# Patient Record
Sex: Male | Born: 1980 | Race: Black or African American | Hispanic: No | Marital: Single | Smoking: Current every day smoker
Health system: Southern US, Community
[De-identification: ages and names within clinical notes are randomized; demographics above are authoritative.]

## PROBLEM LIST (undated history)

## (undated) DIAGNOSIS — Z22322 Carrier or suspected carrier of Methicillin resistant Staphylococcus aureus: Secondary | ICD-10-CM

## (undated) DIAGNOSIS — F329 Major depressive disorder, single episode, unspecified: Secondary | ICD-10-CM

## (undated) DIAGNOSIS — F419 Anxiety disorder, unspecified: Secondary | ICD-10-CM

## (undated) DIAGNOSIS — F32A Depression, unspecified: Secondary | ICD-10-CM

## (undated) HISTORY — PX: HEMORRHOID SURGERY: SHX153

---

## 2010-05-24 ENCOUNTER — Emergency Department (HOSPITAL_COMMUNITY)
Admission: EM | Admit: 2010-05-24 | Discharge: 2010-05-24 | Disposition: A | Discharge status: Home / Self Care | Payer: Self-pay | Attending: Emergency Medicine | Admitting: Emergency Medicine

## 2010-05-24 ENCOUNTER — Emergency Department (HOSPITAL_COMMUNITY): Payer: Self-pay

## 2010-05-24 DIAGNOSIS — K644 Residual hemorrhoidal skin tags: Secondary | ICD-10-CM | POA: Insufficient documentation

## 2010-05-24 DIAGNOSIS — K6289 Other specified diseases of anus and rectum: Secondary | ICD-10-CM | POA: Insufficient documentation

## 2010-05-24 LAB — POCT I-STAT, CHEM 8
Calcium, Ion: 1.11 mmol/L — ABNORMAL LOW (ref 1.12–1.32)
Chloride: 107 mEq/L (ref 96–112)
Glucose, Bld: 98 mg/dL (ref 70–99)
HCT: 44 % (ref 39.0–52.0)
Hemoglobin: 15 g/dL (ref 13.0–17.0)

## 2010-05-24 MED ORDER — IOHEXOL 300 MG/ML  SOLN
100.0000 mL | Freq: Once | INTRAMUSCULAR | Status: AC | PRN
  Administered 2010-05-24: 100 mL via INTRAVENOUS

## 2010-05-25 ENCOUNTER — Emergency Department (HOSPITAL_COMMUNITY)
Admission: EM | Admit: 2010-05-25 | Discharge: 2010-05-26 | Disposition: A | Discharge status: Home / Self Care | Payer: Self-pay | Attending: Emergency Medicine | Admitting: Emergency Medicine

## 2010-05-25 DIAGNOSIS — K644 Residual hemorrhoidal skin tags: Secondary | ICD-10-CM | POA: Insufficient documentation

## 2010-05-25 DIAGNOSIS — K612 Anorectal abscess: Secondary | ICD-10-CM | POA: Insufficient documentation

## 2010-05-25 DIAGNOSIS — K6289 Other specified diseases of anus and rectum: Secondary | ICD-10-CM | POA: Insufficient documentation

## 2010-05-26 LAB — CBC
MCHC: 34.1 g/dL (ref 30.0–36.0)
MCV: 86.5 fL (ref 78.0–100.0)
Platelets: 244 10*3/uL (ref 150–400)
RDW: 12.7 % (ref 11.5–15.5)
WBC: 10.7 10*3/uL — ABNORMAL HIGH (ref 4.0–10.5)

## 2010-05-26 LAB — DIFFERENTIAL
Eosinophils Absolute: 0.3 10*3/uL (ref 0.0–0.7)
Eosinophils Relative: 3 % (ref 0–5)
Lymphs Abs: 3.2 10*3/uL (ref 0.7–4.0)

## 2010-05-26 LAB — BASIC METABOLIC PANEL
BUN: 14 mg/dL (ref 6–23)
Calcium: 9.1 mg/dL (ref 8.4–10.5)
Creatinine, Ser: 1.06 mg/dL (ref 0.4–1.5)
GFR calc non Af Amer: >60 mL/min (ref >60)
Potassium: 3.4 mEq/L — ABNORMAL LOW (ref 3.5–5.1)

## 2011-05-12 ENCOUNTER — Emergency Department (HOSPITAL_COMMUNITY): Payer: Self-pay

## 2011-05-12 ENCOUNTER — Emergency Department (HOSPITAL_COMMUNITY)
Admission: EM | Admit: 2011-05-12 | Discharge: 2011-05-12 | Disposition: A | Discharge status: Home / Self Care | Payer: Self-pay | Attending: Emergency Medicine | Admitting: Emergency Medicine

## 2011-05-12 ENCOUNTER — Encounter (HOSPITAL_COMMUNITY): Payer: Self-pay | Admitting: *Deleted

## 2011-05-12 DIAGNOSIS — T07XXXA Unspecified multiple injuries, initial encounter: Secondary | ICD-10-CM

## 2011-05-12 DIAGNOSIS — IMO0002 Reserved for concepts with insufficient information to code with codable children: Secondary | ICD-10-CM | POA: Insufficient documentation

## 2011-05-12 DIAGNOSIS — Y92410 Unspecified street and highway as the place of occurrence of the external cause: Secondary | ICD-10-CM | POA: Insufficient documentation

## 2011-05-12 DIAGNOSIS — M25519 Pain in unspecified shoulder: Secondary | ICD-10-CM | POA: Insufficient documentation

## 2011-05-12 DIAGNOSIS — M25569 Pain in unspecified knee: Secondary | ICD-10-CM | POA: Insufficient documentation

## 2011-05-12 MED ORDER — ACETAMINOPHEN 325 MG PO TABS
650.0000 mg | ORAL_TABLET | Freq: Once | ORAL | Status: AC
  Administered 2011-05-12: 650 mg via ORAL
  Filled 2011-05-12: qty 2

## 2011-05-12 MED ORDER — HYDROCODONE-ACETAMINOPHEN 5-325 MG PO TABS: ORAL_TABLET | ORAL | Status: DC

## 2011-05-12 MED ORDER — IBUPROFEN 800 MG PO TABS
800.0000 mg | ORAL_TABLET | Freq: Once | ORAL | Status: AC
  Administered 2011-05-12: 800 mg via ORAL
  Filled 2011-05-12: qty 1

## 2011-05-12 NOTE — ED Notes (Signed)
Pt sleeping. 
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
## 2011-05-12 NOTE — Discharge Instructions (Signed)
Contusion  A contusion is a deep bruise. Contusions happen when an injury causes bleeding under the skin. Signs of bruising include pain, puffiness (swelling), and discolored skin. The contusion may turn blue, purple, or yellow.  HOME CARE    Put ice on the injured area.   Put ice in a plastic bag.   Place a towel between your skin and the bag.   Leave the ice on for 15 to 20 minutes, 3 to 4 times a day.   Only take medicine as told by your doctor.   Rest the injured area.   If possible, raise (elevate) the injured area to lessen puffiness.  GET HELP RIGHT AWAY IF:    You have more bruising or puffiness.   You have pain that is getting worse.   Your puffiness or pain is not helped by medicine.  MAKE SURE YOU:    Understand these instructions.   Will watch your condition.   Will get help right away if you are not doing well or get worse.  Document Released: 06/21/2007 Document Revised: 12/22/2010 Document Reviewed: 11/07/2010  [employer] Patient Information 2012 [employer].
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
## 2011-05-18 NOTE — ED Provider Notes (Signed)
Medical screening examination/treatment/procedure(s) were performed by non-physician practitioner and as supervising physician I was immediately available for consultation/collaboration. Devoria Albe, MD, FACEP   Ward Givens, MD 05/18/11 [phone]

## 2011-09-06 ENCOUNTER — Encounter (HOSPITAL_COMMUNITY): Payer: Self-pay | Admitting: Emergency Medicine

## 2011-09-06 ENCOUNTER — Emergency Department (HOSPITAL_COMMUNITY)
Admission: EM | Admit: 2011-09-06 | Discharge: 2011-09-06 | Disposition: A | Discharge status: Home / Self Care | Payer: Self-pay | Attending: Emergency Medicine | Admitting: Emergency Medicine

## 2011-09-06 DIAGNOSIS — F17200 Nicotine dependence, unspecified, uncomplicated: Secondary | ICD-10-CM | POA: Insufficient documentation

## 2011-09-06 DIAGNOSIS — R220 Localized swelling, mass and lump, head: Secondary | ICD-10-CM | POA: Insufficient documentation

## 2011-09-06 DIAGNOSIS — R221 Localized swelling, mass and lump, neck: Secondary | ICD-10-CM | POA: Insufficient documentation

## 2011-09-06 DIAGNOSIS — K1379 Other lesions of oral mucosa: Secondary | ICD-10-CM

## 2011-09-06 DIAGNOSIS — F329 Major depressive disorder, single episode, unspecified: Secondary | ICD-10-CM | POA: Insufficient documentation

## 2011-09-06 DIAGNOSIS — F411 Generalized anxiety disorder: Secondary | ICD-10-CM | POA: Insufficient documentation

## 2011-09-06 DIAGNOSIS — K1370 Unspecified lesions of oral mucosa: Secondary | ICD-10-CM | POA: Insufficient documentation

## 2011-09-06 DIAGNOSIS — F3289 Other specified depressive episodes: Secondary | ICD-10-CM | POA: Insufficient documentation

## 2011-09-06 HISTORY — DX: Major depressive disorder, single episode, unspecified: F32.9

## 2011-09-06 HISTORY — DX: Depression, unspecified: F32.A

## 2011-09-06 HISTORY — DX: Anxiety disorder, unspecified: F41.9

## 2011-09-06 MED ORDER — PENICILLIN V POTASSIUM 500 MG PO TABS: 500.0000 mg | ORAL_TABLET | Freq: Four times a day (QID) | ORAL | Status: DC

## 2011-09-06 MED ORDER — IBUPROFEN 800 MG PO TABS
800.0000 mg | ORAL_TABLET | Freq: Once | ORAL | Status: AC
  Administered 2011-09-06: 800 mg via ORAL
  Filled 2011-09-06: qty 1

## 2011-09-06 MED ORDER — PENICILLIN V POTASSIUM 250 MG PO TABS
500.0000 mg | ORAL_TABLET | Freq: Once | ORAL | Status: AC
  Administered 2011-09-06: 500 mg via ORAL
  Filled 2011-09-06: qty 2

## 2011-09-06 MED ORDER — HYDROCODONE-ACETAMINOPHEN 5-325 MG PO TABS: 1.0000 | ORAL_TABLET | ORAL | Status: DC | PRN

## 2011-09-06 MED ORDER — HYDROCODONE-ACETAMINOPHEN 5-325 MG PO TABS
2.0000 | ORAL_TABLET | Freq: Once | ORAL | Status: AC
  Administered 2011-09-06: 2 via ORAL
  Filled 2011-09-06: qty 2

## 2011-09-06 MED ORDER — IBUPROFEN 800 MG PO TABS: 800.0000 mg | ORAL_TABLET | Freq: Three times a day (TID) | ORAL | Status: DC

## 2011-09-06 NOTE — ED Provider Notes (Signed)
History     CSN: [id number]  Arrival date & time 09/06/11  0230   First MD Initiated Contact with Patient 09/06/11 [phone]      Chief Complaint  Patient presents with  . Dental Pain    (Consider location/radiation/quality/duration/timing/severity/associated sxs/prior treatment) HPI Zachary Krause is a 31 y.o. male who presents to the Emergency Department complaining of dental pain that began yesterday. Pain to bottom lower left jaw with gum pain, radiation to cheek and to ear. Denies fever, chills, stiff neck, trismus. He has taken tylenol without relief.  Past Medical History  Diagnosis Date  . Depression   . Anxiety     Past Surgical History  Procedure Date  . Hemorrhoid surgery     History reviewed. No pertinent family history.  History  Substance Use Topics  . Smoking status: Current Everyday Smoker  . Smokeless tobacco: Not on file  . Alcohol Use: Yes     occ      Review of Systems  Constitutional: Negative for fever.       10 Systems reviewed and are negative for acute change except as noted in the HPI.  HENT: Positive for dental problem. Negative for congestion.   Eyes: Negative for discharge and redness.  Respiratory: Negative for cough and shortness of breath.   Cardiovascular: Negative for chest pain.  Gastrointestinal: Negative for vomiting and abdominal pain.  Musculoskeletal: Negative for back pain.  Skin: Negative for rash.  Neurological: Negative for syncope, numbness and headaches.  Psychiatric/Behavioral:       No behavior change.    Allergies  Pollen extract  Home Medications   Current Outpatient Rx  Name Route Sig Dispense Refill  . HYDROCODONE-ACETAMINOPHEN 5-325 MG PO TABS  1 or 2 po q4h prn pain 15 tablet 0    BP 131/82  Pulse 77  Temp 98.5 F (36.9 C) (Oral)  Resp 16  Ht 6' (1.829 m)  Wt 185 lb (83.915 kg)  BMI 25.09 kg/m2  SpO2 99%  Physical Exam  Nursing note and vitals reviewed. Constitutional:       Awake, alert,  nontoxic appearance.  HENT:  Head: Atraumatic.  Right Ear: External ear normal.  Left Ear: External ear normal.       Gums swollen to 2nd molar. Contusion to gumline behind molar. No abscess noted.  Eyes: Right eye exhibits no discharge. Left eye exhibits no discharge.  Neck: Neck supple.  Pulmonary/Chest: Effort normal and breath sounds normal. He exhibits no tenderness.  Abdominal: Soft. There is no tenderness. There is no rebound.  Musculoskeletal: He exhibits no tenderness.       Baseline ROM, no obvious new focal weakness.  Neurological:       Mental status and motor strength appears baseline for patient and situation.  Skin: No rash noted.  Psychiatric: He has a normal mood and affect.    ED Course  Procedures (including critical care time)     MDM  Patient with left lower gum pain and swelling. Initiated antibiotic therapy. Given antiinflammatory and analgesic.Pt stable in ED with no significant deterioration in condition.The patient appears reasonably screened and/or stabilized for discharge and I doubt any other medical condition or other [facility] requiring further screening, evaluation, or treatment in the ED at this time prior to discharge.  MDM Reviewed: nursing note and vitals           Nicoletta Dress. Colon Branch, MD 09/06/11 [phone]

## 2011-09-06 NOTE — Discharge Instructions (Signed)
Rinse with warm salt water. Take all of the antibiotic. Use the ibuprofen and stronger pain medicine as directed. Follow up with a dentist.

## 2011-09-06 NOTE — ED Notes (Signed)
Patient complaining of dental pain started yesterday. 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

## 2011-09-11 ENCOUNTER — Encounter (HOSPITAL_COMMUNITY): Payer: Self-pay | Admitting: *Deleted

## 2011-09-11 ENCOUNTER — Emergency Department (HOSPITAL_COMMUNITY)
Admission: EM | Admit: 2011-09-11 | Discharge: 2011-09-11 | Disposition: A | Discharge status: Home / Self Care | Payer: Self-pay | Attending: Emergency Medicine | Admitting: Emergency Medicine

## 2011-09-11 DIAGNOSIS — K006 Disturbances in tooth eruption: Secondary | ICD-10-CM | POA: Insufficient documentation

## 2011-09-11 DIAGNOSIS — F17200 Nicotine dependence, unspecified, uncomplicated: Secondary | ICD-10-CM | POA: Insufficient documentation

## 2011-09-11 DIAGNOSIS — Z760 Encounter for issue of repeat prescription: Secondary | ICD-10-CM | POA: Insufficient documentation

## 2011-09-11 MED ORDER — PENICILLIN V POTASSIUM 500 MG PO TABS: 500.0000 mg | ORAL_TABLET | Freq: Four times a day (QID) | ORAL | Status: AC

## 2011-09-11 MED ORDER — IBUPROFEN 800 MG PO TABS: 800.0000 mg | ORAL_TABLET | Freq: Once | ORAL | Status: DC

## 2011-09-11 MED ORDER — HYDROCODONE-ACETAMINOPHEN 5-325 MG PO TABS
1.0000 | ORAL_TABLET | Freq: Once | ORAL | Status: AC
  Administered 2011-09-11: 1 via ORAL
  Filled 2011-09-11: qty 1

## 2011-09-11 MED ORDER — PENICILLIN V POTASSIUM 250 MG PO TABS
500.0000 mg | ORAL_TABLET | Freq: Once | ORAL | Status: AC
  Administered 2011-09-11: 500 mg via ORAL
  Filled 2011-09-11: qty 2

## 2011-09-11 MED ORDER — IBUPROFEN 100 MG/5ML PO SUSP: 800.0000 mg | Freq: Once | ORAL | Status: DC

## 2011-09-11 MED ORDER — HYDROCODONE-ACETAMINOPHEN 5-325 MG PO TABS: 1.0000 | ORAL_TABLET | Freq: Four times a day (QID) | ORAL | Status: AC | PRN

## 2011-09-11 MED ORDER — IBUPROFEN 800 MG PO TABS
ORAL_TABLET | ORAL | Status: AC
  Administered 2011-09-11: 800 mg
  Filled 2011-09-11: qty 1

## 2011-09-11 NOTE — ED Notes (Signed)
Pt with dental pain, seen Sat for same and has lost prescription for meds

## 2011-09-11 NOTE — ED Provider Notes (Signed)
History     CSN: [id number]  Arrival date & time 09/11/11  1321   First MD Initiated Contact with Patient 09/11/11 1350      Chief Complaint  Patient presents with  . Dental Pain    (Consider location/radiation/quality/duration/timing/severity/associated sxs/prior treatment) HPI Comments: Here 5 days ago for same c/o.  Written rx's for pen VK and hydrocodone.  "i didn't have any money to get the rx's filled.  i now have the money but i lost my rx's".    Patient is a 31 y.o. male presenting with tooth pain. The history is provided by the patient. No language interpreter was used.  Dental PainThe primary symptoms include mouth pain. Primary symptoms do not include dental injury or fever. Episode onset: ~ 6 days ago. The symptoms are unchanged. The symptoms occur constantly.  Additional symptoms include: jaw pain. Additional symptoms do not include: trismus, facial swelling and drooling.    Past Medical History  Diagnosis Date  . Depression   . Anxiety     Past Surgical History  Procedure Date  . Hemorrhoid surgery     History reviewed. No pertinent family history.  History  Substance Use Topics  . Smoking status: Current Everyday Smoker  . Smokeless tobacco: Not on file  . Alcohol Use: Yes     occ      Review of Systems  Constitutional: Negative for fever and chills.  HENT: Positive for dental problem. Negative for facial swelling and drooling.   Cardiovascular: Negative for chest pain.  All other systems reviewed and are negative.    Allergies  Pollen extract  Home Medications   Current Outpatient Rx  Name Route Sig Dispense Refill  . HYDROCODONE-ACETAMINOPHEN 5-325 MG PO TABS Oral Take 1 tablet by mouth every 6 (six) hours as needed for pain. 20 tablet 0  . PENICILLIN V POTASSIUM 500 MG PO TABS Oral Take 1 tablet (500 mg total) by mouth 4 (four) times daily. 40 tablet 0    BP 116/78  Pulse 68  Temp 97.8 F (36.6 C) (Oral)  Resp 20  Ht 6' (1.829 m)   Wt 185 lb (83.915 kg)  BMI 25.09 kg/m2  SpO2 98%  Physical Exam  Nursing note and vitals reviewed. Constitutional: He is oriented to person, place, and time. He appears well-developed and well-nourished.  HENT:  Head: Normocephalic and atraumatic.  Mouth/Throat:    Eyes: EOM are normal.  Neck: Normal range of motion.  Cardiovascular: Normal rate, regular rhythm, normal heart sounds and intact distal pulses.   Pulmonary/Chest: Effort normal and breath sounds normal. No respiratory distress.  Abdominal: Soft. He exhibits no distension. There is no tenderness.  Musculoskeletal: Normal range of motion.  Neurological: He is alert and oriented to person, place, and time.  Skin: Skin is warm and dry.  Psychiatric: He has a normal mood and affect. Judgment normal.    ED Course  Procedures (including critical care time)  Labs Reviewed - No data to display No results found.   1. Prescription refill       MDM  Hydrocodone, 20 rx-pen VK 500 mg , 40        Evalina Field, [place] 09/11/11 1544

## 2011-09-11 NOTE — ED Notes (Signed)
Dental pain , seen here Saturday , and given Rx, says he has lost them when he tried to get them filled today.

## 2011-09-12 NOTE — ED Provider Notes (Signed)
Medical screening examination/treatment/procedure(s) were performed by non-physician practitioner and as supervising physician I was immediately available for consultation/collaboration.   Carleene Cooper III, MD 09/12/11 [phone]

## 2011-10-01 ENCOUNTER — Emergency Department (HOSPITAL_COMMUNITY)
Admission: EM | Admit: 2011-10-01 | Discharge: 2011-10-01 | Discharge status: Against Medical Advice | Payer: Self-pay | Attending: Emergency Medicine | Admitting: Emergency Medicine

## 2011-10-01 ENCOUNTER — Encounter (HOSPITAL_COMMUNITY): Payer: Self-pay | Admitting: Emergency Medicine

## 2011-10-01 DIAGNOSIS — R21 Rash and other nonspecific skin eruption: Secondary | ICD-10-CM | POA: Insufficient documentation

## 2011-10-01 NOTE — ED Notes (Signed)
Pt states that he has to leave because he has to go to work, pt signed AMA form and left ambulatory and in stable condition

## 2011-10-01 NOTE — ED Notes (Signed)
Pt with rash to bilateral forearms, trunk and lower back x 3 days; states that he has working outside about 3 days ago

## 2011-10-01 NOTE — ED Notes (Signed)
Pt states rash appeared 3 days ago after leaving work. Rashes located on arms, abdomen, and back. C/O of itching and burning sometimes. Gradually appeared. States that the rashes are painful at certain times of the day.

## 2011-11-25 ENCOUNTER — Emergency Department (HOSPITAL_COMMUNITY): Payer: No Typology Code available for payment source

## 2011-11-25 ENCOUNTER — Emergency Department (HOSPITAL_COMMUNITY)
Admission: EM | Admit: 2011-11-25 | Discharge: 2011-11-25 | Disposition: A | Discharge status: Home / Self Care | Payer: No Typology Code available for payment source | Attending: Emergency Medicine | Admitting: Emergency Medicine

## 2011-11-25 ENCOUNTER — Encounter (HOSPITAL_COMMUNITY): Payer: Self-pay | Admitting: *Deleted

## 2011-11-25 DIAGNOSIS — F17200 Nicotine dependence, unspecified, uncomplicated: Secondary | ICD-10-CM | POA: Insufficient documentation

## 2011-11-25 DIAGNOSIS — S63502A Unspecified sprain of left wrist, initial encounter: Secondary | ICD-10-CM

## 2011-11-25 DIAGNOSIS — F411 Generalized anxiety disorder: Secondary | ICD-10-CM | POA: Insufficient documentation

## 2011-11-25 DIAGNOSIS — S60222A Contusion of left hand, initial encounter: Secondary | ICD-10-CM

## 2011-11-25 DIAGNOSIS — IMO0002 Reserved for concepts with insufficient information to code with codable children: Secondary | ICD-10-CM | POA: Insufficient documentation

## 2011-11-25 DIAGNOSIS — T07XXXA Unspecified multiple injuries, initial encounter: Secondary | ICD-10-CM

## 2011-11-25 DIAGNOSIS — Y9355 Activity, bike riding: Secondary | ICD-10-CM | POA: Insufficient documentation

## 2011-11-25 DIAGNOSIS — F329 Major depressive disorder, single episode, unspecified: Secondary | ICD-10-CM | POA: Insufficient documentation

## 2011-11-25 DIAGNOSIS — S60229A Contusion of unspecified hand, initial encounter: Secondary | ICD-10-CM | POA: Insufficient documentation

## 2011-11-25 DIAGNOSIS — S59909A Unspecified injury of unspecified elbow, initial encounter: Secondary | ICD-10-CM | POA: Insufficient documentation

## 2011-11-25 DIAGNOSIS — F3289 Other specified depressive episodes: Secondary | ICD-10-CM | POA: Insufficient documentation

## 2011-11-25 DIAGNOSIS — S6990XA Unspecified injury of unspecified wrist, hand and finger(s), initial encounter: Secondary | ICD-10-CM | POA: Insufficient documentation

## 2011-11-25 DIAGNOSIS — S63599A Other specified sprain of unspecified wrist, initial encounter: Secondary | ICD-10-CM | POA: Insufficient documentation

## 2011-11-25 DIAGNOSIS — S66819A Strain of other specified muscles, fascia and tendons at wrist and hand level, unspecified hand, initial encounter: Secondary | ICD-10-CM | POA: Insufficient documentation

## 2011-11-25 DIAGNOSIS — S59919A Unspecified injury of unspecified forearm, initial encounter: Secondary | ICD-10-CM | POA: Insufficient documentation

## 2011-11-25 MED ORDER — NAPROXEN 250 MG PO TABS: 250.0000 mg | ORAL_TABLET | Freq: Two times a day (BID) | ORAL | Status: DC

## 2011-11-25 MED ORDER — TETANUS-DIPHTH-ACELL PERTUSSIS 5-2.5-18.5 LF-MCG/0.5 IM SUSP
0.5000 mL | Freq: Once | INTRAMUSCULAR | Status: AC
  Administered 2011-11-25: 0.5 mL via INTRAMUSCULAR
  Filled 2011-11-25: qty 0.5

## 2011-11-25 MED ORDER — METHOCARBAMOL 500 MG PO TABS: 1000.0000 mg | ORAL_TABLET | Freq: Four times a day (QID) | ORAL | Status: DC | PRN

## 2011-11-25 MED ORDER — HYDROCODONE-ACETAMINOPHEN 5-325 MG PO TABS: ORAL_TABLET | ORAL | Status: DC

## 2011-11-25 MED ORDER — OXYCODONE-ACETAMINOPHEN 5-325 MG PO TABS
1.0000 | ORAL_TABLET | Freq: Once | ORAL | Status: AC
  Administered 2011-11-25: 1 via ORAL
  Filled 2011-11-25: qty 1

## 2011-11-25 MED ORDER — IBUPROFEN 400 MG PO TABS
400.0000 mg | ORAL_TABLET | Freq: Once | ORAL | Status: AC
  Administered 2011-11-25: 400 mg via ORAL
  Filled 2011-11-25: qty 1
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
## 2011-12-01 ENCOUNTER — Emergency Department (HOSPITAL_COMMUNITY)
Admission: EM | Admit: 2011-12-01 | Discharge: 2011-12-01 | Disposition: A | Discharge status: Home / Self Care | Payer: No Typology Code available for payment source | Attending: Emergency Medicine | Admitting: Emergency Medicine

## 2011-12-01 ENCOUNTER — Encounter (HOSPITAL_COMMUNITY): Payer: Self-pay | Admitting: Emergency Medicine

## 2011-12-01 DIAGNOSIS — F329 Major depressive disorder, single episode, unspecified: Secondary | ICD-10-CM | POA: Insufficient documentation

## 2011-12-01 DIAGNOSIS — Z8659 Personal history of other mental and behavioral disorders: Secondary | ICD-10-CM | POA: Insufficient documentation

## 2011-12-01 DIAGNOSIS — F3289 Other specified depressive episodes: Secondary | ICD-10-CM | POA: Insufficient documentation

## 2011-12-01 DIAGNOSIS — L089 Local infection of the skin and subcutaneous tissue, unspecified: Secondary | ICD-10-CM

## 2011-12-01 DIAGNOSIS — Y9389 Activity, other specified: Secondary | ICD-10-CM | POA: Insufficient documentation

## 2011-12-01 DIAGNOSIS — F17200 Nicotine dependence, unspecified, uncomplicated: Secondary | ICD-10-CM | POA: Insufficient documentation

## 2011-12-01 MED ORDER — CEFTRIAXONE SODIUM 1 G IJ SOLR
1.0000 g | Freq: Once | INTRAMUSCULAR | Status: AC
  Administered 2011-12-01: 1 g via INTRAMUSCULAR
  Filled 2011-12-01: qty 10

## 2011-12-01 MED ORDER — HYDROCODONE-ACETAMINOPHEN 5-500 MG PO TABS: 1.0000 | ORAL_TABLET | Freq: Four times a day (QID) | ORAL | Status: DC | PRN

## 2011-12-01 MED ORDER — LIDOCAINE HCL (PF) 1 % IJ SOLN
2.0000 mL | Freq: Once | INTRAMUSCULAR | Status: AC
  Administered 2011-12-01: 2 mL via INTRADERMAL
  Filled 2011-12-01: qty 5

## 2011-12-01 MED ORDER — CEPHALEXIN 500 MG PO CAPS: 500.0000 mg | ORAL_CAPSULE | Freq: Four times a day (QID) | ORAL | Status: DC
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
## 2011-12-27 ENCOUNTER — Encounter (HOSPITAL_COMMUNITY): Payer: Self-pay | Admitting: Emergency Medicine

## 2011-12-27 ENCOUNTER — Emergency Department (HOSPITAL_COMMUNITY)
Admission: EM | Admit: 2011-12-27 | Discharge: 2011-12-27 | Disposition: A | Discharge status: Home / Self Care | Payer: Self-pay | Attending: Emergency Medicine | Admitting: Emergency Medicine

## 2011-12-27 DIAGNOSIS — L02419 Cutaneous abscess of limb, unspecified: Secondary | ICD-10-CM | POA: Insufficient documentation

## 2011-12-27 DIAGNOSIS — Z79899 Other long term (current) drug therapy: Secondary | ICD-10-CM | POA: Insufficient documentation

## 2011-12-27 DIAGNOSIS — F17200 Nicotine dependence, unspecified, uncomplicated: Secondary | ICD-10-CM | POA: Insufficient documentation

## 2011-12-27 DIAGNOSIS — L03119 Cellulitis of unspecified part of limb: Secondary | ICD-10-CM | POA: Insufficient documentation

## 2011-12-27 DIAGNOSIS — L0291 Cutaneous abscess, unspecified: Secondary | ICD-10-CM

## 2011-12-27 DIAGNOSIS — Z8659 Personal history of other mental and behavioral disorders: Secondary | ICD-10-CM | POA: Insufficient documentation

## 2011-12-27 HISTORY — DX: Carrier or suspected carrier of methicillin resistant Staphylococcus aureus: Z22.322

## 2011-12-27 MED ORDER — CEPHALEXIN 500 MG PO CAPS: 500.0000 mg | ORAL_CAPSULE | Freq: Four times a day (QID) | ORAL | Status: DC

## 2011-12-27 MED ORDER — SULFAMETHOXAZOLE-TRIMETHOPRIM 800-160 MG PO TABS: 1.0000 | ORAL_TABLET | Freq: Two times a day (BID) | ORAL | Status: DC

## 2011-12-27 MED ORDER — LIDOCAINE-EPINEPHRINE (PF) 2 %-1:200000 IJ SOLN
20.0000 mL | Freq: Once | INTRAMUSCULAR | Status: AC
  Administered 2011-12-27: 20 mL

## 2011-12-27 MED ORDER — LIDOCAINE-EPINEPHRINE 2 %-1:100000 IJ SOLN: 30.0000 mL | Freq: Once | INTRAMUSCULAR | Status: DC

## 2011-12-27 MED ORDER — LIDOCAINE-EPINEPHRINE (PF) 2 %-1:200000 IJ SOLN
INTRAMUSCULAR | Status: AC
  Administered 2011-12-27: 20 mL
  Filled 2011-12-27: qty 20
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
## 2011-12-28 ENCOUNTER — Emergency Department (HOSPITAL_COMMUNITY)
Admission: EM | Admit: 2011-12-28 | Discharge: 2011-12-28 | Disposition: A | Discharge status: Home / Self Care | Payer: Self-pay | Attending: Emergency Medicine | Admitting: Emergency Medicine

## 2011-12-28 ENCOUNTER — Encounter (HOSPITAL_COMMUNITY): Payer: Self-pay | Admitting: Emergency Medicine

## 2011-12-28 DIAGNOSIS — Z8659 Personal history of other mental and behavioral disorders: Secondary | ICD-10-CM | POA: Insufficient documentation

## 2011-12-28 DIAGNOSIS — F17200 Nicotine dependence, unspecified, uncomplicated: Secondary | ICD-10-CM | POA: Insufficient documentation

## 2011-12-28 DIAGNOSIS — L0201 Cutaneous abscess of face: Secondary | ICD-10-CM | POA: Insufficient documentation

## 2011-12-28 DIAGNOSIS — L03211 Cellulitis of face: Secondary | ICD-10-CM | POA: Insufficient documentation

## 2011-12-28 MED ORDER — LIDOCAINE-EPINEPHRINE (PF) 1 %-1:200000 IJ SOLN
INTRAMUSCULAR | Status: AC
  Administered 2011-12-28: 10 mL
  Filled 2011-12-28: qty 10

## 2011-12-28 MED ORDER — BACITRACIN-NEOMYCIN-POLYMYXIN 400-5-5000 EX OINT
TOPICAL_OINTMENT | CUTANEOUS | Status: AC
  Administered 2011-12-28: 4
  Filled 2011-12-28: qty 4

## 2011-12-28 NOTE — ED Notes (Signed)
Patient was seen here earlier and had abscesses I&D.  Abscess to left eyebrow was not able to be I&D; patient states has been using warm compresses and states it began draining around 9pm.  Patient here to have wound rechecked.

## 2011-12-28 NOTE — ED Notes (Signed)
Pt alert & oriented x4, stable gait. Patient given discharge instructions, paperwork & prescription(s). Patient  instructed to stop at the registration desk to finish any additional paperwork. Patient verbalized understanding. Pt left department w/ no further questions. 

## 2011-12-28 NOTE — ED Provider Notes (Signed)
History     CSN: [id number]  Arrival date & time 12/28/11  0031   First MD Initiated Contact with Patient 12/28/11 0035      Chief Complaint  Patient presents with  . Abscess    (Consider location/radiation/quality/duration/timing/severity/associated sxs/prior treatment) The history is provided by the patient.   patient reports he had an incision and drainage done of his left eyebrow earlier today in the emergency department but reports that the swelling of his left eyebrow seems to be worse and states that the provider who saw him stated that they were unable to get all the drainage.  He's concerned about the worsening swelling.  No fever or spreading redness.  No other complaints.  His symptoms are mild in severity.  Nothing worsens or improves his symptoms.  No change in his vision.  No pain with range of motion of his eyes  Past Medical History  Diagnosis Date  . Depression   . Anxiety     Past Surgical History  Procedure Date  . Hemorrhoid surgery     Family History  Problem Relation Age of Onset  . Hypertension Other   . Cancer Other     History  Substance Use Topics  . Smoking status: Current Every Day Smoker -- 0.5 packs/day for 13 years    Types: Cigarettes  . Smokeless tobacco: Never Used  . Alcohol Use: Yes     Comment: occ      Review of Systems  All other systems reviewed and are negative.    Allergies  Pollen extract  Home Medications   Current Outpatient Rx  Name  Route  Sig  Dispense  Refill  . CEPHALEXIN 500 MG PO CAPS   Oral   Take 1 capsule (500 mg total) by mouth 4 (four) times daily.   40 capsule   0   . SULFAMETHOXAZOLE-TRIMETHOPRIM 800-160 MG PO TABS   Oral   Take 1 tablet by mouth every 12 (twelve) hours.   20 tablet   0     BP 146/82  Pulse 88  Temp 98 F (36.7 C) (Oral)  Resp 18  Ht 6' (1.829 m)  Wt 190 lb (86.183 kg)  BMI 25.77 kg/m2  SpO2 100%  Physical Exam  Constitutional: He is oriented to person,  place, and time. He appears well-developed and well-nourished.  HENT:  Head: Normocephalic.       Abscess of the small amount of drainage of his left upper lateral eyebrow.  No surrounding erythema or warmth or tenderness.  Normal extraocular movement  Eyes: EOM are normal.  Neck: Normal range of motion.  Pulmonary/Chest: Effort normal.  Abdominal: He exhibits no distension.  Musculoskeletal: Normal range of motion.  Neurological: He is alert and oriented to person, place, and time.  Psychiatric: He has a normal mood and affect.    ED Course  Procedures (including critical care time)  INCISION AND DRAINAGE Performed by: Lyanne Co Consent: Verbal consent obtained. Risks and benefits: risks, benefits and alternatives were discussed Time out performed prior to procedure Type: abscess Body area: left eyebrow Anesthesia: local infiltration Incision was made with a scalpel. Local anesthetic: lidocaine 2% with epinephrine Anesthetic total: 3 ml Complexity: complex Blunt dissection to break up loculations Drainage: purulent Drainage amount: moderate Packing material: none Patient tolerance: Patient tolerated the procedure well with no immediate complications.     Labs Reviewed - No data to display No results found.   1. Abscess of face  MDM  Repeat incision and drainage of left eyebrow with moderate amount of purulent drainage obtained.  The patient just started his Bactrim and Keflex today.  I recommended that he continue this.        Lyanne Co, MD 12/28/11 [phone]

## 2011-12-29 ENCOUNTER — Telehealth (HOSPITAL_COMMUNITY): Payer: Self-pay | Admitting: Emergency Medicine

## 2011-12-29 LAB — CULTURE, ROUTINE-ABSCESS

## 2011-12-30 ENCOUNTER — Encounter (HOSPITAL_COMMUNITY): Payer: Self-pay | Admitting: Emergency Medicine

## 2011-12-30 ENCOUNTER — Telehealth (HOSPITAL_COMMUNITY): Payer: Self-pay | Admitting: Emergency Medicine
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
## 2011-12-30 NOTE — ED Notes (Signed)
+  Abscess. +MRSA. Patient treated with Septra DS. Sensitive to same. Per protocol MD.

## 2011-12-30 NOTE — ED Provider Notes (Signed)
Medical screening examination/treatment/procedure(s) were performed by non-physician practitioner and as supervising physician I was immediately available for consultation/collaboration.   Laray Anger, DO 12/30/11 1336

## 2011-12-31 ENCOUNTER — Telehealth (HOSPITAL_COMMUNITY): Payer: Self-pay | Admitting: Emergency Medicine

## 2012-01-01 NOTE — ED Notes (Signed)
Unable to contact patient via phone. Sent letter. °

## 2012-01-07 NOTE — ED Notes (Signed)
Patient treated per protocol. Chart sent to Medical Records. °

## 2012-11-22 ENCOUNTER — Encounter (HOSPITAL_COMMUNITY): Payer: Self-pay | Admitting: Emergency Medicine

## 2012-11-22 ENCOUNTER — Emergency Department (HOSPITAL_COMMUNITY)
Admission: EM | Admit: 2012-11-22 | Discharge: 2012-11-22 | Disposition: A | Discharge status: Home / Self Care | Payer: Self-pay | Attending: Emergency Medicine | Admitting: Emergency Medicine

## 2012-11-22 ENCOUNTER — Emergency Department (HOSPITAL_COMMUNITY): Payer: Self-pay

## 2012-11-22 DIAGNOSIS — M79609 Pain in unspecified limb: Secondary | ICD-10-CM | POA: Insufficient documentation

## 2012-11-22 DIAGNOSIS — R21 Rash and other nonspecific skin eruption: Secondary | ICD-10-CM | POA: Insufficient documentation

## 2012-11-22 DIAGNOSIS — Z792 Long term (current) use of antibiotics: Secondary | ICD-10-CM | POA: Insufficient documentation

## 2012-11-22 DIAGNOSIS — F419 Anxiety disorder, unspecified: Secondary | ICD-10-CM

## 2012-11-22 DIAGNOSIS — Z79899 Other long term (current) drug therapy: Secondary | ICD-10-CM | POA: Insufficient documentation

## 2012-11-22 DIAGNOSIS — Z8659 Personal history of other mental and behavioral disorders: Secondary | ICD-10-CM | POA: Insufficient documentation

## 2012-11-22 DIAGNOSIS — Z8614 Personal history of Methicillin resistant Staphylococcus aureus infection: Secondary | ICD-10-CM | POA: Insufficient documentation

## 2012-11-22 DIAGNOSIS — F17200 Nicotine dependence, unspecified, uncomplicated: Secondary | ICD-10-CM | POA: Insufficient documentation

## 2012-11-22 DIAGNOSIS — F411 Generalized anxiety disorder: Secondary | ICD-10-CM | POA: Insufficient documentation

## 2012-11-22 DIAGNOSIS — R51 Headache: Secondary | ICD-10-CM | POA: Insufficient documentation

## 2012-11-22 DIAGNOSIS — M79675 Pain in left toe(s): Secondary | ICD-10-CM

## 2012-11-22 MED ORDER — NAPROXEN 500 MG PO TABS: 500.0000 mg | ORAL_TABLET | Freq: Two times a day (BID) | ORAL | Status: DC
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
## 2012-11-22 NOTE — ED Provider Notes (Signed)
CSN: [id number]     Arrival date & time 11/22/12  4098 History  This chart was scribed for Zachary Jakes, MD by Leone Payor, ED Scribe. This patient was seen in room APA10/APA10 and the patient's care was started 7:12 AM.    Chief Complaint  Patient presents with  . Toe Pain    The history is provided by the patient. No language interpreter was used.     HPI Comments: Zachary Krause is a 32 y.o. male who presents to the Emergency Department complaining of constant, gradually worsening left pinky toe pain for over 5 months ago. Pt states he has struck the area on various objects while walking and believes it may be broken. Pt also has discoloration and thickness to the toenails which he believes will require anti-fungals and would like to be treated for this. Pt states the pain is worse while wearing shoes. He denies weakness or numbness.   Pt also complains of a history of anxiety and depression for many years. Pt has been off his medications for about 5 years and would like to follow up to resume care for this. He denies SI, HI.   Past Medical History  Diagnosis Date  . Depression   . Anxiety   . MRSA (methicillin resistant staph aureus) culture positive    Past Surgical History  Procedure Laterality Date  . Hemorrhoid surgery     Family History  Problem Relation Age of Onset  . Hypertension Other   . Cancer Other    History  Substance Use Topics  . Smoking status: Current Every Day Smoker -- 0.50 packs/day for 13 years    Types: Cigarettes  . Smokeless tobacco: Never Used  . Alcohol Use: Yes     Comment: occ    Review of Systems  Constitutional: Negative for fever and chills.  HENT: Negative for rhinorrhea and sore throat.   Eyes: Negative for visual disturbance.  Respiratory: Negative for cough and shortness of breath.   Cardiovascular: Negative for chest pain and leg swelling.  Gastrointestinal: Negative for nausea, vomiting, abdominal pain and diarrhea.   Genitourinary: Negative for dysuria.  Musculoskeletal: Positive for arthralgias (left pinky toe pain). Negative for back pain.  Skin: Positive for rash.  Neurological: Positive for headaches.  Hematological: Does not bruise/bleed easily.  Psychiatric/Behavioral: Negative for confusion.    Allergies  Pollen extract  Home Medications   Current Outpatient Rx  Name  Route  Sig  Dispense  Refill  . cephALEXin (KEFLEX) 500 MG capsule   Oral   Take 1 capsule (500 mg total) by mouth 4 (four) times daily.   40 capsule   0   . naproxen (NAPROSYN) 500 MG tablet   Oral   Take 1 tablet (500 mg total) by mouth 2 (two) times daily.   14 tablet   0   . sulfamethoxazole-trimethoprim (SEPTRA DS) 800-160 MG per tablet   Oral   Take 1 tablet by mouth every 12 (twelve) hours.   20 tablet   0    BP 122/78  Pulse 67  Temp(Src) 98.2 F (36.8 C) (Oral)  Resp 20  Ht 6' (1.829 m)  Wt 185 lb 9 oz (84.171 kg)  BMI 25.16 kg/m2  SpO2 100% Physical Exam  Nursing note and vitals reviewed. Constitutional: He is oriented to person, place, and time. He appears well-developed and well-nourished.  HENT:  Head: Normocephalic and atraumatic.  Cardiovascular: Normal rate.   Pulmonary/Chest: Effort normal.  Abdominal: Soft. Bowel  sounds are normal. He exhibits no distension. There is no tenderness.  Neurological: He is alert and oriented to person, place, and time. He has normal strength. No cranial nerve deficit. He exhibits normal muscle tone.  Skin: Skin is warm and dry.  Psychiatric: He has a normal mood and affect.    ED Course  Procedures   DIAGNOSTIC STUDIES: Oxygen Saturation is 100% on RA, normal by my interpretation.    COORDINATION OF CARE: 7:32 AM Discussed with the need to seek a PCP. Pt understands that he needs regular follow up for anti-fungals as well as his psych medications. Discussed treatment plan with pt at bedside and pt agreed to plan.   Labs Review Labs Reviewed -  No data to display Imaging Review Dg Foot Complete Left  11/22/2012   CLINICAL DATA:  Pain post trauma  EXAM: LEFT FOOT - COMPLETE 3+ VIEW  COMPARISON:  None.  FINDINGS: Frontal, oblique, and lateral views were obtained. There is no fracture or dislocation. Joint spaces appear intact. No erosive change.  IMPRESSION: No abnormality noted.   Electronically Signed   By: Bretta Bang M.D.   On: 11/22/2012 08:04    EKG Interpretation   None       MDM   1. Toe pain, left   2. Anxiety    Patient with several month history of left little toe pain x-ray showed no fracture or evidence of infection clinical exam shows no evidence of open wounds or cellulitis of the feet. Does have chronic toenail infection. Recommend followup with podiatry referrals provided. Patient also raises concerns about anxiety and depression denies any suicidal or homicidal ideations patient given behavioral health outpatient referral information.  I personally performed the services described in this documentation, which was scribed in my presence. The recorded information has been reviewed and is accurate.    Zachary Jakes, MD 11/22/12 [phone]

## 2014-07-08 ENCOUNTER — Encounter (HOSPITAL_COMMUNITY): Payer: Self-pay | Admitting: Emergency Medicine

## 2014-07-08 ENCOUNTER — Emergency Department (HOSPITAL_COMMUNITY)
Admission: EM | Admit: 2014-07-08 | Discharge: 2014-07-08 | Disposition: A | Discharge status: Home / Self Care | Payer: Self-pay | Attending: Emergency Medicine | Admitting: Emergency Medicine

## 2014-07-08 DIAGNOSIS — Z792 Long term (current) use of antibiotics: Secondary | ICD-10-CM | POA: Insufficient documentation

## 2014-07-08 DIAGNOSIS — Z791 Long term (current) use of non-steroidal anti-inflammatories (NSAID): Secondary | ICD-10-CM | POA: Insufficient documentation

## 2014-07-08 DIAGNOSIS — L02416 Cutaneous abscess of left lower limb: Secondary | ICD-10-CM | POA: Insufficient documentation

## 2014-07-08 DIAGNOSIS — Z720 Tobacco use: Secondary | ICD-10-CM | POA: Insufficient documentation

## 2014-07-08 DIAGNOSIS — F329 Major depressive disorder, single episode, unspecified: Secondary | ICD-10-CM | POA: Insufficient documentation

## 2014-07-08 MED ORDER — SULFAMETHOXAZOLE-TRIMETHOPRIM 800-160 MG PO TABS
1.0000 | ORAL_TABLET | Freq: Once | ORAL | Status: AC
  Administered 2014-07-08: 1 via ORAL
  Filled 2014-07-08: qty 1

## 2014-07-08 MED ORDER — PREDNISONE 50 MG PO TABS
60.0000 mg | ORAL_TABLET | Freq: Once | ORAL | Status: AC
  Administered 2014-07-08: 60 mg via ORAL
  Filled 2014-07-08 (×2): qty 1

## 2014-07-08 MED ORDER — IBUPROFEN 800 MG PO TABS
800.0000 mg | ORAL_TABLET | Freq: Once | ORAL | Status: AC
  Administered 2014-07-08: 800 mg via ORAL
  Filled 2014-07-08: qty 1

## 2014-07-08 MED ORDER — CLINDAMYCIN HCL 150 MG PO CAPS: 150.0000 mg | ORAL_CAPSULE | Freq: Four times a day (QID) | ORAL | Status: DC

## 2014-07-08 MED ORDER — DEXAMETHASONE 4 MG PO TABS: 4.0000 mg | ORAL_TABLET | Freq: Two times a day (BID) | ORAL | Status: DC

## 2014-07-08 NOTE — ED Provider Notes (Signed)
CSN: [id number]     Arrival date & time 07/08/14  1724 History   First MD Initiated Contact with Patient 07/08/14 1746     Chief Complaint  Patient presents with  . Abscess     (Consider location/radiation/quality/duration/timing/severity/associated sxs/prior Treatment) Patient is a 34 y.o. male presenting with abscess. The history is provided by the patient.  Abscess Location:  Leg Leg abscess location:  L upper leg Abscess quality: itching, painful and warmth   Abscess quality: not draining and not weeping   Red streaking: no   Duration:  2 days Progression:  Worsening Pain details:    Quality:  Aching   Severity:  Moderate   Duration:  2 days   Timing:  Intermittent   Progression:  Worsening Context: not diabetes and not immunosuppression   Relieved by:  Nothing Ineffective treatments:  None tried Associated symptoms: no fatigue, no fever, no nausea and no vomiting   Risk factors: hx of MRSA     Past Medical History  Diagnosis Date  . Depression   . Anxiety   . MRSA (methicillin resistant staph aureus) culture positive    Past Surgical History  Procedure Laterality Date  . Hemorrhoid surgery     Family History  Problem Relation Age of Onset  . Hypertension Other   . Cancer Other    History  Substance Use Topics  . Smoking status: Current Every Day Smoker -- 0.50 packs/day for 13 years    Types: Cigarettes  . Smokeless tobacco: Never Used  . Alcohol Use: Yes     Comment: occ    Review of Systems  Constitutional: Negative for fever and fatigue.  Gastrointestinal: Negative for nausea and vomiting.  Skin: Positive for wound.  Psychiatric/Behavioral: The patient is nervous/anxious.        Depression      Allergies  Pollen extract  Home Medications   Prior to Admission medications   Medication Sig Start Date End Date Taking? Authorizing Provider  cephALEXin (KEFLEX) 500 MG capsule Take 1 capsule (500 mg total) by mouth 4 (four) times daily.  12/27/11   Burgess Amor, PA-C  naproxen (NAPROSYN) 500 MG tablet Take 1 tablet (500 mg total) by mouth 2 (two) times daily. 11/22/12   Vanetta Mulders, MD  sulfamethoxazole-trimethoprim (SEPTRA DS) 800-160 MG per tablet Take 1 tablet by mouth every 12 (twelve) hours. 12/27/11   Burgess Amor, PA-C   BP 137/81 mmHg  Pulse 89  Temp(Src) 97.9 F (36.6 C) (Oral)  Ht 6' (1.829 m)  Wt 189 lb (85.73 kg)  BMI 25.63 kg/m2  SpO2 97% Physical Exam  Constitutional: He is oriented to person, place, and time. He appears well-developed and well-nourished.  Non-toxic appearance.  HENT:  Head: Normocephalic.  Right Ear: Tympanic membrane and external ear normal.  Left Ear: Tympanic membrane and external ear normal.  Eyes: EOM and lids are normal. Pupils are equal, round, and reactive to light.  Neck: Normal range of motion. Neck supple. Carotid bruit is not present.  Cardiovascular: Normal rate, regular rhythm, normal heart sounds, intact distal pulses and normal pulses.   Pulmonary/Chest: Breath sounds normal. No respiratory distress.  Abdominal: Soft. Bowel sounds are normal. There is no tenderness. There is no guarding.  Musculoskeletal: Normal range of motion.  Lymphadenopathy:       Head (right side): No submandibular adenopathy present.       Head (left side): No submandibular adenopathy present.    He has no cervical adenopathy.  Neurological: He  is alert and oriented to person, place, and time. He has normal strength. No cranial nerve deficit or sensory deficit.  Skin: Skin is warm and dry.  Small slightly raised abscess area of the anterior left thigh. No red streaking appreciated. No satellite abscess areas noted. No palpable inguinal nodes.  Psychiatric: He has a normal mood and affect. His speech is normal.  Nursing note and vitals reviewed.   ED Course  Procedures (including critical care time) Labs Review Labs Reviewed - No data to display  Imaging Review No results found.   EKG  Interpretation None      MDM  Abscess noted of the left upper thigh. No vital sign changes. No drainage appreciated at this time. The patient will be treated with clindamycin and Decadron. Patient will use warm tub soaks daily. He will return if any signs of advancing infection.    Final diagnoses:  None    *I have reviewed nursing notes, vital signs, and all appropriate lab and imaging results for this patient.[address], PA-C 07/08/14 1810  Zadie Rhine, MD 07/09/14 [phone]

## 2014-07-08 NOTE — Discharge Instructions (Signed)
Please soak the left thigh in warm Epsom salt water for routine minutes daily until the wound has healed. Please use clindamycin with breakfast, lunch, dinner, and at bedtime until all taken. Please use Decadron 2 times daily with food. Abscess An abscess (boil or furuncle) is an infected area on or under the skin. This area is filled with yellowish-white fluid (pus) and other material (debris). HOME CARE   Only take medicines as told by your doctor.  If you were given antibiotic medicine, take it as directed. Finish the medicine even if you start to feel better.  If gauze is used, follow your doctor's directions for changing the gauze.  To avoid spreading the infection:  Keep your abscess covered with a bandage.  Wash your hands well.  Do not share personal care items, towels, or whirlpools with others.  Avoid skin contact with others.  Keep your skin and clothes clean around the abscess.  Keep all doctor visits as told. GET HELP RIGHT AWAY IF:   You have more pain, puffiness (swelling), or redness in the wound site.  You have more fluid or blood coming from the wound site.  You have muscle aches, chills, or you feel sick.  You have a fever. MAKE SURE YOU:   Understand these instructions.  Will watch your condition.  Will get help right away if you are not doing well or get worse. Document Released: 06/21/2007 Document Revised: 07/04/2011 Document Reviewed: 03/17/2011 [facility] Patient Information 2015 [place], [place]. This information is not intended to replace advice given to you by your health care provider. Make sure you discuss any questions you have with your health care provider.

## 2014-07-08 NOTE — ED Notes (Signed)
Pt states that he "got bit by something" on upper left thigh.  States area red and painful with no drainage.

## 2016-04-08 ENCOUNTER — Emergency Department (HOSPITAL_COMMUNITY): Payer: Self-pay

## 2016-04-08 ENCOUNTER — Emergency Department (HOSPITAL_COMMUNITY)
Admission: EM | Admit: 2016-04-08 | Discharge: 2016-04-08 | Disposition: A | Discharge status: Home / Self Care | Payer: Self-pay | Attending: Emergency Medicine | Admitting: Emergency Medicine

## 2016-04-08 ENCOUNTER — Encounter (HOSPITAL_COMMUNITY): Payer: Self-pay | Admitting: *Deleted

## 2016-04-08 DIAGNOSIS — I517 Cardiomegaly: Secondary | ICD-10-CM | POA: Insufficient documentation

## 2016-04-08 DIAGNOSIS — R55 Syncope and collapse: Secondary | ICD-10-CM | POA: Insufficient documentation

## 2016-04-08 DIAGNOSIS — Z5181 Encounter for therapeutic drug level monitoring: Secondary | ICD-10-CM | POA: Insufficient documentation

## 2016-04-08 DIAGNOSIS — F1210 Cannabis abuse, uncomplicated: Secondary | ICD-10-CM | POA: Insufficient documentation

## 2016-04-08 DIAGNOSIS — F17210 Nicotine dependence, cigarettes, uncomplicated: Secondary | ICD-10-CM | POA: Insufficient documentation

## 2016-04-08 DIAGNOSIS — Y939 Activity, unspecified: Secondary | ICD-10-CM | POA: Insufficient documentation

## 2016-04-08 DIAGNOSIS — Y9289 Other specified places as the place of occurrence of the external cause: Secondary | ICD-10-CM | POA: Insufficient documentation

## 2016-04-08 DIAGNOSIS — W228XXA Striking against or struck by other objects, initial encounter: Secondary | ICD-10-CM | POA: Insufficient documentation

## 2016-04-08 DIAGNOSIS — S0181XA Laceration without foreign body of other part of head, initial encounter: Secondary | ICD-10-CM | POA: Insufficient documentation

## 2016-04-08 DIAGNOSIS — Y999 Unspecified external cause status: Secondary | ICD-10-CM | POA: Insufficient documentation

## 2016-04-08 LAB — CBC
HCT: 40.8 % (ref 39.0–52.0)
HEMOGLOBIN: 13.9 g/dL (ref 13.0–17.0)
MCH: 29.8 pg (ref 26.0–34.0)
MCHC: 34.1 g/dL (ref 30.0–36.0)
MCV: 87.4 fL (ref 78.0–100.0)
PLATELETS: 243 10*3/uL (ref 150–400)
RBC: 4.67 MIL/uL (ref 4.22–5.81)
RDW: 12.9 % (ref 11.5–15.5)
WBC: 7.8 10*3/uL (ref 4.0–10.5)

## 2016-04-08 LAB — URINALYSIS, ROUTINE W REFLEX MICROSCOPIC
BACTERIA UA: NONE SEEN
BILIRUBIN URINE: NEGATIVE
Glucose, UA: NEGATIVE mg/dL
HGB URINE DIPSTICK: NEGATIVE
Ketones, ur: NEGATIVE mg/dL
LEUKOCYTES UA: NEGATIVE
Nitrite: NEGATIVE
PROTEIN: 30 mg/dL — AB
RBC / HPF: NONE SEEN RBC/hpf (ref 0–5)
SPECIFIC GRAVITY, URINE: 1.024 (ref 1.005–1.030)
SQUAMOUS EPITHELIAL / LPF: NONE SEEN
pH: 5 (ref 5.0–8.0)

## 2016-04-08 LAB — BASIC METABOLIC PANEL
ANION GAP: 10 (ref 5–15)
BUN: 12 mg/dL (ref 6–20)
CALCIUM: 8.9 mg/dL (ref 8.9–10.3)
CHLORIDE: 104 mmol/L (ref 101–111)
CO2: 25 mmol/L (ref 22–32)
CREATININE: 1.22 mg/dL (ref 0.61–1.24)
GFR calc non Af Amer: >60 mL/min (ref >60)
GLUCOSE: 107 mg/dL — AB (ref 65–99)
Potassium: 3.7 mmol/L (ref 3.5–5.1)
Sodium: 139 mmol/L (ref 135–145)

## 2016-04-08 LAB — RAPID URINE DRUG SCREEN, HOSP PERFORMED
Amphetamines: NOT DETECTED
BARBITURATES: NOT DETECTED
BENZODIAZEPINES: NOT DETECTED
COCAINE: NOT DETECTED
OPIATES: NOT DETECTED
TETRAHYDROCANNABINOL: POSITIVE — AB

## 2016-04-08 LAB — ETHANOL: Alcohol, Ethyl (B): <5 mg/dL (ref <5)

## 2016-04-08 LAB — TROPONIN I: Troponin I: <0.03 ng/mL (ref <0.03)

## 2016-04-08 MED ORDER — SODIUM CHLORIDE 0.9 % IV BOLUS (SEPSIS)
1000.0000 mL | Freq: Once | INTRAVENOUS | Status: AC
  Administered 2016-04-08: 1000 mL via INTRAVENOUS

## 2016-04-08 NOTE — ED Triage Notes (Signed)
c/o rt face pain

## 2016-04-08 NOTE — ED Triage Notes (Signed)
The pt fainted  1-2 hours he had ems come out they told  Him he had an abnormal ekg but he refused transport hx of the same 16 years ago

## 2016-04-08 NOTE — ED Notes (Signed)
Pt verbalized understanding of d/c instructions and has no further questions. Pt is stable, A&Ox4, VSS.  

## 2016-04-08 NOTE — Discharge Instructions (Signed)
1. Medications: usual home medications 2. Treatment: rest, drink plenty of fluids,  3. Follow Up: Please followup with your primary doctor in 2-3 days for discussion of your diagnoses and further evaluation after today's visit; if you do not have a primary care doctor use the resource guide provided to find one; Please return to the ER for return of symptoms

## 2016-04-08 NOTE — ED Provider Notes (Signed)
[facility]-EMERGENCY DEPT Provider Note   CSN: [id number] Arrival date & time: 04/08/16  0100     History   Chief Complaint Chief Complaint  Patient presents with  . Loss of Consciousness    HPI Zachary Krause is a 36 y.o. male with a hx of Anxiety, depression, MRSA infection presents to the Emergency Department complaining of acute, syncopal episode onset several hours prior to arrival. Patient reports he was outside with his girlfriend when he began to feel himself "fading out." Patient girlfriend reports that he fell over onto her and then fell down striking his face on the ground. She denies seizure activity. Patient denies drug use and I reports only one alcoholic drink. Patient denies chest pain or shortness of breath prior to the episode. Patient reports one similar episode several years ago but he was never evaluated.  No known aggravating or alleviating factors. He denies recent illness, recent travel, swelling of his legs, palpitations. He denies personal cardiac history. He denies early cardiac death in his family history.  The history is provided by the patient and medical records. No language interpreter was used.    Past Medical History:  Diagnosis Date  . Anxiety   . Depression   . MRSA (methicillin resistant staph aureus) culture positive     There are no active problems to display for this patient.   Past Surgical History:  Procedure Laterality Date  . HEMORRHOID SURGERY         Home Medications    Prior to Admission medications   Not on File    Family History Family History  Problem Relation Age of Onset  . Hypertension Other   . Cancer Other     Social History Social History  Substance Use Topics  . Smoking status: Current Every Day Smoker    Packs/day: 0.50    Years: 13.00    Types: Cigarettes  . Smokeless tobacco: Never Used  . Alcohol use Yes     Comment: occ     Allergies   Pollen extract   Review of Systems Review of Systems    HENT: Positive for dental problem and facial swelling.   Neurological: Positive for syncope.  All other systems reviewed and are negative.    Physical Exam Updated Vital Signs BP 140/72   Pulse 67   Temp 98.5 F (36.9 C) (Oral)   Resp 16   Ht 6' (1.829 m)   Wt 86.2 kg   SpO2 99%   BMI 25.77 kg/m   Physical Exam  Constitutional: He is oriented to person, place, and time. He appears well-developed and well-nourished. No distress.  HENT:  Head: Normocephalic.  Mouth/Throat: Oropharynx is clear and moist.  Contusion and superficial laceration to the right zygomatic arch Superficial laceration to the right lower lip Fracture of the right front incisor  Eyes: Conjunctivae and EOM are normal. Pupils are equal, round, and reactive to light. No scleral icterus.  No horizontal, vertical or rotational nystagmus  Neck: Normal range of motion. Neck supple.  Full active and passive ROM without pain No midline or paraspinal tenderness No nuchal rigidity or meningeal signs  Cardiovascular: Normal rate, regular rhythm and intact distal pulses.   Pulmonary/Chest: Effort normal and breath sounds normal. No respiratory distress. He has no wheezes. He has no rales.  Abdominal: Soft. Bowel sounds are normal. There is no tenderness. There is no rebound and no guarding.  Musculoskeletal: Normal range of motion.  Lymphadenopathy:    He has no  cervical adenopathy.  Neurological: He is alert and oriented to person, place, and time. No cranial nerve deficit. He exhibits normal muscle tone. Coordination normal.  Mental Status:  Alert, oriented, thought content appropriate. Speech fluent without evidence of aphasia. Able to follow 2 step commands without difficulty.  Cranial Nerves:  II:  Peripheral visual fields grossly normal, pupils equal, round, reactive to light III,IV, VI: ptosis not present, extra-ocular motions intact bilaterally  V,VII: smile symmetric, facial light touch sensation  equal VIII: hearing grossly normal bilaterally  IX,X: midline uvula rise  XI: bilateral shoulder shrug equal and strong XII: midline tongue extension  Motor:  5/5 in upper and lower extremities bilaterally including strong and equal grip strength and dorsiflexion/plantar flexion Sensory: Pinprick and light touch normal in all extremities.  Cerebellar: normal finger-to-nose with bilateral upper extremities Gait: normal gait and balance CV: distal pulses palpable throughout   Skin: Skin is warm and dry. No rash noted. He is not diaphoretic.  Psychiatric: He has a normal mood and affect. His behavior is normal. Judgment and thought content normal.  Nursing note and vitals reviewed.    ED Treatments / Results  Labs (all labs ordered are listed, but only abnormal results are displayed) Labs Reviewed  BASIC METABOLIC PANEL - Abnormal; Notable for the following:       Result Value   Glucose, Bld 107 (*)    All other components within normal limits  RAPID URINE DRUG SCREEN, HOSP PERFORMED - Abnormal; Notable for the following:    Tetrahydrocannabinol POSITIVE (*)    All other components within normal limits  URINALYSIS, ROUTINE W REFLEX MICROSCOPIC - Abnormal; Notable for the following:    Protein, ur 30 (*)    All other components within normal limits  CBC  TROPONIN I  ETHANOL    EKG  EKG Interpretation  Date/Time:  Saturday April 08 2016 01:16:01 EDT Ventricular Rate:  69 PR Interval:  160 QRS Duration: 98 QT Interval:  376 QTC Calculation: 402 R Axis:   94 Text Interpretation:  Normal sinus rhythm Rightward axis Early repolarization Borderline ECG No old tracing to compare Confirmed by CAMPOS  MD, KEVIN ([id number]) on 04/08/2016 6:57:11 AM       Radiology Dg Chest 2 View  Result Date: 04/08/2016 CLINICAL DATA:  Acute onset of syncope.  Initial encounter. EXAM: CHEST  2 VIEW COMPARISON:  Chest radiograph performed 11/25/2011 FINDINGS: The lungs are well-aerated and clear.  There is no evidence of focal opacification, pleural effusion or pneumothorax. The heart is normal in size; the mediastinal contour is within normal limits. No acute osseous abnormalities are seen. IMPRESSION: No acute cardiopulmonary process seen. Electronically Signed   By: Roanna Raider M.D.   On: 04/08/2016 01:53   Ct Head Wo Contrast  Result Date: 04/08/2016 CLINICAL DATA:  Initial evaluation for syncope, fall. EXAM: CT HEAD WITHOUT CONTRAST CT MAXILLOFACIAL WITHOUT CONTRAST TECHNIQUE: Multidetector CT imaging of the head and maxillofacial structures were performed using the standard protocol without intravenous contrast. Multiplanar CT image reconstructions of the maxillofacial structures were also generated. COMPARISON:  Prior head CT from 11/25/2011. FINDINGS: CT HEAD FINDINGS Brain: Cerebral volume normal. No acute intracranial hemorrhage. No evidence for acute infarct. No mass lesion, midline shift or mass effect. No hydrocephalus. No extra-axial fluid collection. Vascular: No hyperdense vessel. Skull: Scalp soft tissues within normal limits.  Calvarium intact. Other: Mastoids are clear. CT MAXILLOFACIAL FINDINGS Osseous:  Study degraded by motion. Zygomatic arches intact. No acute maxillary fracture. Pterygoid  plates intact. Nasal bones grossly intact. Nasal septum mildly bowed to the right but intact. No definite acute mandibular fracture. Mandibular condyles normally positioned. No acute abnormality about the dentition. Orbits: Globes intact. No retro-orbital hematoma or other pathology. Bony orbits intact. Sinuses: Tiny retention cysts noted within left maxillary sinus. Otherwise clear. Soft tissues: No appreciable soft tissue swelling about the face. IMPRESSION: 1. No acute intracranial process. 2. No acute maxillofacial injury identified. Electronically Signed   By: Rise Mu M.D.   On: 04/08/2016 06:32   Ct Maxillofacial Wo Contrast  Result Date: 04/08/2016 CLINICAL DATA:   Initial evaluation for syncope, fall. EXAM: CT HEAD WITHOUT CONTRAST CT MAXILLOFACIAL WITHOUT CONTRAST TECHNIQUE: Multidetector CT imaging of the head and maxillofacial structures were performed using the standard protocol without intravenous contrast. Multiplanar CT image reconstructions of the maxillofacial structures were also generated. COMPARISON:  Prior head CT from 11/25/2011. FINDINGS: CT HEAD FINDINGS Brain: Cerebral volume normal. No acute intracranial hemorrhage. No evidence for acute infarct. No mass lesion, midline shift or mass effect. No hydrocephalus. No extra-axial fluid collection. Vascular: No hyperdense vessel. Skull: Scalp soft tissues within normal limits.  Calvarium intact. Other: Mastoids are clear. CT MAXILLOFACIAL FINDINGS Osseous:  Study degraded by motion. Zygomatic arches intact. No acute maxillary fracture. Pterygoid plates intact. Nasal bones grossly intact. Nasal septum mildly bowed to the right but intact. No definite acute mandibular fracture. Mandibular condyles normally positioned. No acute abnormality about the dentition. Orbits: Globes intact. No retro-orbital hematoma or other pathology. Bony orbits intact. Sinuses: Tiny retention cysts noted within left maxillary sinus. Otherwise clear. Soft tissues: No appreciable soft tissue swelling about the face. IMPRESSION: 1. No acute intracranial process. 2. No acute maxillofacial injury identified. Electronically Signed   By: Rise Mu M.D.   On: 04/08/2016 06:32    Procedures Procedures (including critical care time)  Medications Ordered in ED Medications  sodium chloride 0.9 % bolus 1,000 mL (0 mLs Intravenous Stopped 04/08/16 0736)     Initial Impression / Assessment and Plan / ED Course  I have reviewed the triage vital signs and the nursing notes.  Pertinent labs & imaging results that were available during my care of the patient were reviewed by me and considered in my medical decision making (see chart  for details).     Patient without arrhythmia or tachycardia while here in the department.  Patient without history of congestive heart failure, normal hematocrit, no shortness of breath and systolic blood pressure greater than 90; patient is low risk. EKG does show LVH, likely common for patient. Will plan for discharge home with close cardiology follow-up.  Possibility of recurrent syncope has been discussed. I discussed reasons to avoid driving until cardiology followup and other safety preventions including use of ladders and working at heights.   Pt has remained hemodynamically stable throughout their time in the ED  BP 140/72   Pulse 67   Temp 98.5 F (36.9 C) (Oral)   Resp 16   Ht 6' (1.829 m)   Wt 86.2 kg   SpO2 99%   BMI 25.77 kg/m    The patient was discussed with Dr. Patria Mane who evaluated the ECG agrees with the treatment plan.    Final Clinical Impressions(s) / ED Diagnoses   Final diagnoses:  Syncope and collapse  Facial laceration, initial encounter  LVH (left ventricular hypertrophy)    New Prescriptions Discharge Medication List as of 04/08/2016  7:13 AM       Dierdre Forth, PA-C  04/08/16 0801    Azalia BilisKevin Campos, MD 04/08/16 [phone]

## 2016-04-10 ENCOUNTER — Ambulatory Visit: Payer: Self-pay | Attending: Internal Medicine | Admitting: Physician Assistant

## 2016-04-10 ENCOUNTER — Encounter: Payer: Self-pay | Admitting: Physician Assistant

## 2016-04-10 VITALS — BP 121/77 | HR 60 | Temp 98.2°F | Resp 16 | Wt 199.8 lb

## 2016-04-10 DIAGNOSIS — F329 Major depressive disorder, single episode, unspecified: Secondary | ICD-10-CM | POA: Insufficient documentation

## 2016-04-10 DIAGNOSIS — Z888 Allergy status to other drugs, medicaments and biological substances status: Secondary | ICD-10-CM | POA: Insufficient documentation

## 2016-04-10 DIAGNOSIS — R002 Palpitations: Secondary | ICD-10-CM | POA: Insufficient documentation

## 2016-04-10 DIAGNOSIS — F17200 Nicotine dependence, unspecified, uncomplicated: Secondary | ICD-10-CM

## 2016-04-10 DIAGNOSIS — F419 Anxiety disorder, unspecified: Secondary | ICD-10-CM | POA: Insufficient documentation

## 2016-04-10 DIAGNOSIS — R55 Syncope and collapse: Secondary | ICD-10-CM | POA: Insufficient documentation

## 2016-04-10 DIAGNOSIS — Z9889 Other specified postprocedural states: Secondary | ICD-10-CM | POA: Insufficient documentation

## 2016-04-10 DIAGNOSIS — G43909 Migraine, unspecified, not intractable, without status migrainosus: Secondary | ICD-10-CM | POA: Insufficient documentation

## 2016-04-10 NOTE — Patient Instructions (Signed)
Syncope °Syncope is when you lose temporarily pass out (faint). Signs that you may be about to pass out include: °· Feeling dizzy or light-headed. °· Feeling sick to your stomach (nauseous). °· Seeing all white or all black. °· Having cold, clammy skin. °If you passed out, get help right away. Call your local emergency services (911 in the [place] Do not drive yourself to the hospital. °Follow these instructions at home: °Pay attention to any changes in your symptoms. Take these actions to help with your condition: °· Have someone stay with you until you feel stable. °· Do not drive, use machinery, or play sports until your doctor says it is okay. °· Keep all follow-up visits as told by your doctor. This is important. °· If you start to feel like you might pass out, lie down right away and raise (elevate) your feet above the level of your heart. Breathe deeply and steadily. Wait until all of the symptoms are gone. °· Drink enough fluid to keep your pee (urine) clear or pale yellow. °· If you are taking blood pressure or heart medicine, get up slowly and spend many minutes getting ready to sit and then stand. This can help with dizziness. °· Take over-the-counter and prescription medicines only as told by your doctor. °Get help right away if: °· You have a very bad headache. °· You have unusual pain in your chest, tummy, or back. °· You are bleeding from your mouth or rectum. °· You have black or tarry poop (stool). °· You have a very fast or uneven heartbeat (palpitations). °· It hurts to breathe. °· You pass out once or more than once. °· You have jerky movements that you cannot control (seizure). °· You are confused. °· You have trouble walking. °· You are very weak. °· You have vision problems. °These symptoms may be an emergency. Do not wait to see if the symptoms will go away. Get medical help right away. Call your local emergency services (911 in the [place] Do not drive yourself to the hospital. °This  information is not intended to replace advice given to you by your health care provider. Make sure you discuss any questions you have with your health care provider. °Document Released: 06/21/2007 Document Revised: 06/10/2015 Document Reviewed: 09/16/2014 °Elsevier Interactive Patient Education © 2017 Elsevier Inc. ° °

## 2016-04-10 NOTE — Progress Notes (Signed)
Zachary MannanJerry Krause  YQM:[id number]  [id number]  DOB - [birth year]  Chief Complaint  Patient presents with  . Follow-up  . Loss of Consciousness       Subjective:   Zachary MannanJerry Krause is a 36 y.o. male here today for establishment of care. He has no significant PMHx. He presented to the emergency department on 04/08/2016 after a blackout at home. He was outside standing this occurred. He was talking to someone and felt himself fading out. He was not able to brace himself and fell towards the friend then slumped to the floor. He struck his face. No seizure activity. No drugs. He had had one Beer but was not intoxicated. No chest pain at the moment. No palpitations or lightheadedness prior to this episode. However, in the past she's had at least 3 other episodes where he is experienced palpitations. He's been having some migraines. He is frequently been urinating but blood sugars are within normal limits. In the emergency department his CBC and BMP were okay. ETOH ok. UDS with cannabis only. His urinalysis is okay. His chest x-ray showed no active disease. CT scan of his head was negative. Facial films looked okay. He was treated with IV fluids. He's not had a recurrence of symptoms. He is here today for follow-up with no new complaints.  ROS: GEN: denies fever or chills, denies change in weight Skin: denies lesions or rashes HEENT: +headache, earache, epistaxis, sore throat, or neck pain LUNGS: denies SHOB, dyspnea, PND, orthopnea CV: denies CP no palpitations presently ABD: denies abd pain, N or V EXT: denies muscle spasms or swelling; no pain in lower ext, no weakness NEURO: denies numbness or tingling, denies sz, stroke or TIA  ALLERGIES: Allergies  Allergen Reactions  . Pollen Extract Other (See Comments)    Seasonal allergies     PAST MEDICAL HISTORY: Past Medical History:  Diagnosis Date  . Anxiety   . Depression   . MRSA (methicillin resistant staph aureus) culture positive      PAST SURGICAL HISTORY: Past Surgical History:  Procedure Laterality Date  . HEMORRHOID SURGERY      MEDICATIONS AT HOME: Prior to Admission medications   Not on File    Family History  Problem Relation Age of Onset  . Hypertension Other   . Cancer Other     Social-unmarried, [place]; smokers;works for a [employer] company   Objective:   Vitals:   04/10/16 1522  BP: 121/77  Pulse: 60  Resp: 16  Temp: 98.2 F (36.8 C)  TempSrc: Oral  SpO2: 96%  Weight: 199 lb 12.8 oz (90.6 kg)    Exam General appearance : Awake, alert, not in any distress. Speech Clear. Not toxic looking HEENT: Atraumatic and Normocephalic, pupils equally reactive to light and accomodation; busted lower lip towards left Neck: supple, no JVD. No cervical lymphadenopathy.  Chest:Good air entry bilaterally, no added sounds  CVS: S1 S2 regular, no murmurs.  Abdomen: Bowel sounds present, Non tender and not distended with no guarding, rigidity or rebound. Extremities: B/L Lower Ext shows no edema, both legs are warm to touch Neurology: Awake alert, and oriented X 3, CN II-XII intact, Non focal Skin:No Rash Wounds:busted left lower lip  Data Review CBG-93 A1C-5.6%  Assessment & Plan  1. Syncope ?etiology  -CARDS referral  -adequate hydration  -ETOH cessation 2. Palpitations  -TSH   -CARDS referral 3. Anxiety  -wishes to defer meds at this time, Citalopram in past   Return  in about 2 weeks (around 04/24/2016) for follow up syncope.  The patient was given clear instructions to go to ER or return to medical center if symptoms don't improve, worsen or new problems develop. The patient verbalized understanding. The patient was told to call to get lab results if they haven't heard anything in the next week.   Total time spent with patient was 19 min. Greater than 50 % of this visit was spent face to face counseling and coordinating care regarding risk factor  modification, compliance importance and encouragement, education related to syncope and heart evaluation.  This note has been created with Education officer, environmental. Any transcriptional errors are unintentional.    Scot Jun, PA-C [facility] and [facility] [place], [place] [phone]   04/10/2016, 3:46 PM

## 2016-04-11 LAB — TSH: TSH: 1.36 u[IU]/mL (ref 0.450–4.500)

## 2016-04-20 ENCOUNTER — Ambulatory Visit: Payer: Self-pay | Admitting: Family Medicine

## 2016-04-20 NOTE — Progress Notes (Deleted)
   Subjective:  Patient ID: Zachary Krause, male    DOB: December 23, 1980  Age: 37 y.o. MRN: [id number]  CC: No chief complaint on file.   HPI Zachary Krause presents for   History of syncope: Any episode since last visit? Cardiology referral?   Alcoholism:  Anxiety:   Resources for anxiety?   No outpatient prescriptions prior to visit.   No facility-administered medications prior to visit.     ROS Review of Systems      Objective:  There were no vitals taken for this visit.  BP/Weight 04/10/2016 04/08/2016 07/08/2014  Systolic BP 121 140 137  Diastolic BP 77 72 81  Wt. (Lbs) 199.8 190 189  BMI 27.1 25.77 25.63     Physical Exam   Assessment & Plan:   Problem List Items Addressed This Visit    None      No orders of the defined types were placed in this encounter.   Follow-up: No Follow-up on file.   Lizbeth Bark FNP

## 2016-04-21 ENCOUNTER — Encounter: Payer: Self-pay | Admitting: Cardiology

## 2016-04-28 ENCOUNTER — Encounter: Payer: Self-pay | Admitting: Family Medicine

## 2016-04-28 ENCOUNTER — Telehealth: Payer: Self-pay | Admitting: *Deleted

## 2016-04-28 ENCOUNTER — Ambulatory Visit: Payer: Self-pay | Attending: Family Medicine | Admitting: Family Medicine

## 2016-04-28 ENCOUNTER — Other Ambulatory Visit: Payer: Self-pay

## 2016-04-28 VITALS — BP 125/74 | HR 71 | Temp 98.3°F | Resp 18 | Ht 72.0 in | Wt 192.0 lb

## 2016-04-28 DIAGNOSIS — Z8249 Family history of ischemic heart disease and other diseases of the circulatory system: Secondary | ICD-10-CM | POA: Insufficient documentation

## 2016-04-28 DIAGNOSIS — I951 Orthostatic hypotension: Secondary | ICD-10-CM | POA: Insufficient documentation

## 2016-04-28 DIAGNOSIS — R001 Bradycardia, unspecified: Secondary | ICD-10-CM | POA: Insufficient documentation

## 2016-04-28 DIAGNOSIS — R142 Eructation: Secondary | ICD-10-CM

## 2016-04-28 DIAGNOSIS — F419 Anxiety disorder, unspecified: Secondary | ICD-10-CM | POA: Insufficient documentation

## 2016-04-28 DIAGNOSIS — Z87898 Personal history of other specified conditions: Secondary | ICD-10-CM

## 2016-04-28 MED ORDER — SIMETHICONE 80 MG PO CHEW: 80.0000 mg | CHEWABLE_TABLET | Freq: Four times a day (QID) | ORAL | 0 refills | Status: DC | PRN

## 2016-04-28 NOTE — Progress Notes (Signed)
Subjective:  Patient ID: Zachary Krause, male    DOB: November 21, 1980  Age: 36 y.o. MRN: [id number]  CC: Loss of Consciousness   HPI Zachary Krause presents for   Syncope: He reports passing out two weeks ago while sitting. He reports a similar episode last year. He denies any CP or SOB. History of palpations 1 week ago while at rest. Family history of HTN, mother and father. Reports family history of heart disease. He reports maternal uncle has a defibrillator placed in his 74's.   Anxiety: Since childhood. Reports counseling in the past for anxiety and medication use. 10 years ago reports using citalopram  for anxiety but stop use because symptoms improved. Denies any SI/HI.   Belching: Denies any N/V, heartburn, or epigastric pain. Denies taking anything for symptoms.    No outpatient prescriptions prior to visit.   No facility-administered medications prior to visit.     ROS Review of Systems  Respiratory: Negative.   Cardiovascular: Negative.   Gastrointestinal:       Gas  Neurological: Positive for syncope.  Psychiatric/Behavioral: The patient is nervous/anxious.     Objective:  BP 125/74 (BP Location: Left Arm, Patient Position: Sitting, Cuff Size: Normal)   Pulse 71   Temp 98.3 F (36.8 C) (Oral)   Resp 18   Ht 6' (1.829 m)   Wt 192 lb (87.1 kg)   SpO2 99%   BMI 26.04 kg/m   BP/Weight 04/28/2016 04/10/2016 04/08/2016  Systolic BP 125 121 140  Diastolic BP 74 77 72  Wt. (Lbs) 192 199.8 190  BMI 26.04 27.1 25.77     Physical Exam  Constitutional: He is oriented to person, place, and time.  HENT:  Head: Normocephalic and atraumatic.  Right Ear: External ear normal.  Left Ear: External ear normal.  Nose: Nose normal.  Mouth/Throat: Oropharynx is clear and moist.  Eyes: Conjunctivae and EOM are normal. Pupils are equal, round, and reactive to light.  Neck: No JVD present.  Cardiovascular: Normal rate, regular rhythm, normal heart sounds and intact distal pulses.    Pulmonary/Chest: Effort normal and breath sounds normal.  Abdominal: Soft. Bowel sounds are normal.  Musculoskeletal: Normal range of motion.  Neurological: He is alert and oriented to person, place, and time. He has normal reflexes.  Skin: Skin is warm and dry.  Psychiatric: His mood appears anxious. He expresses no homicidal and no suicidal ideation. He expresses no suicidal plans and no homicidal plans.  Nursing note and vitals reviewed.  Assessment & Plan:   Problem List Items Addressed This Visit      Other   Anxiety       - Declines medication use at this time. He is agreeable to receiving counseling resources at this time.   Other Visit Diagnoses    History of syncope    -  Primary   -EKG showed bradycardia.   Relevant Orders   Basic metabolic panel (Completed)   CBC with Differential (Completed)   ECHOCARDIOGRAM COMPLETE   VAS US CAROTID   Ambulatory referral to Cardiology   Orthostatic hypotension       Positive orthostatic VS.   Relevant Orders   Basic metabolic panel (Completed)   CBC with Differential (Completed)   ECHOCARDIOGRAM COMPLETE   VAS US CAROTID   Ambulatory referral to Cardiology   Belching       Relevant Medications   simethicone (GAS-X) 80 MG chewable tablet      Meds ordered this encounter  Medications  .  simethicone (GAS-X) 80 MG chewable tablet    Sig: Chew 1 tablet (80 mg total) by mouth every 6 (six) hours as needed for flatulence.    Dispense:  30 tablet    Refill:  0    Order Specific Question:   Supervising Provider    Answer:   Quentin Angst [id number]    Follow-up: Return if symptoms worsen or fail to improve. Return in about 2 months (around 06/28/2016), for Syncope.   Zachary Bark FNP

## 2016-04-28 NOTE — Patient Instructions (Signed)
Apply for orange card to complete referral process. You will be called with your labs results.    Syncope Syncope is when you lose temporarily pass out (faint). Signs that you may be about to pass out include:  Feeling dizzy or light-headed.  Feeling sick to your stomach (nauseous).  Seeing all white or all black.  Having cold, clammy skin. If you passed out, get help right away. Call your local emergency services (911 in the [place] Do not drive yourself to the hospital. Follow these instructions at home: Pay attention to any changes in your symptoms. Take these actions to help with your condition:  Have someone stay with you until you feel stable.  Do not drive, use machinery, or play sports until your doctor says it is okay.  Keep all follow-up visits as told by your doctor. This is important.  If you start to feel like you might pass out, lie down right away and raise (elevate) your feet above the level of your heart. Breathe deeply and steadily. Wait until all of the symptoms are gone.  Drink enough fluid to keep your pee (urine) clear or pale yellow.  If you are taking blood pressure or heart medicine, get up slowly and spend many minutes getting ready to sit and then stand. This can help with dizziness.  Take over-the-counter and prescription medicines only as told by your doctor. Get help right away if:  You have a very bad headache.  You have unusual pain in your chest, tummy, or back.  You are bleeding from your mouth or rectum.  You have black or tarry poop (stool).  You have a very fast or uneven heartbeat (palpitations).  It hurts to breathe.  You pass out once or more than once.  You have jerky movements that you cannot control (seizure).  You are confused.  You have trouble walking.  You are very weak.  You have vision problems. These symptoms may be an emergency. Do not wait to see if the symptoms will go away. Get medical help right away. Call  your local emergency services (911 in the [place] Do not drive yourself to the hospital. This information is not intended to replace advice given to you by your health care provider. Make sure you discuss any questions you have with your health care provider. Document Released: 06/21/2007 Document Revised: 06/10/2015 Document Reviewed: 09/16/2014 Elsevier Interactive Patient Education  2017 [employer].

## 2016-04-28 NOTE — Telephone Encounter (Signed)
MA spoke with Zachary Krause and informed patient of ECHO and VAS of Carotid being scheduled for April 19 th at 8:00 and 9:00 am at the [facility]. No further questions at this time.

## 2016-04-29 LAB — CBC WITH DIFFERENTIAL/PLATELET
Basophils Absolute: 0 10*3/uL (ref 0.0–0.2)
Basos: 0 %
EOS (ABSOLUTE): 0 10*3/uL (ref 0.0–0.4)
EOS: 1 %
HEMATOCRIT: 41.7 % (ref 37.5–51.0)
Hemoglobin: 14.1 g/dL (ref 13.0–17.7)
Immature Grans (Abs): 0 10*3/uL (ref 0.0–0.1)
Immature Granulocytes: 0 %
LYMPHS ABS: 2.3 10*3/uL (ref 0.7–3.1)
Lymphs: 38 %
MCH: 29.6 pg (ref 26.6–33.0)
MCHC: 33.8 g/dL (ref 31.5–35.7)
MCV: 88 fL (ref 79–97)
MONOS ABS: 0.5 10*3/uL (ref 0.1–0.9)
Monocytes: 8 %
Neutrophils Absolute: 3.2 10*3/uL (ref 1.4–7.0)
Neutrophils: 53 %
PLATELETS: 268 10*3/uL (ref 150–379)
RBC: 4.76 x10E6/uL (ref 4.14–5.80)
RDW: 13.7 % (ref 12.3–15.4)
WBC: 6 10*3/uL (ref 3.4–10.8)

## 2016-04-29 LAB — BASIC METABOLIC PANEL
BUN / CREAT RATIO: 13 (ref 9–20)
BUN: 14 mg/dL (ref 6–20)
CHLORIDE: 102 mmol/L (ref 96–106)
CO2: 23 mmol/L (ref 18–29)
CREATININE: 1.08 mg/dL (ref 0.76–1.27)
Calcium: 9.2 mg/dL (ref 8.7–10.2)
GFR calc Af Amer: 100 mL/min/{1.73_m2} (ref >59)
GFR calc non Af Amer: 87 mL/min/{1.73_m2} (ref >59)
GLUCOSE: 97 mg/dL (ref 65–99)
POTASSIUM: 4.6 mmol/L (ref 3.5–5.2)
SODIUM: 140 mmol/L (ref 134–144)

## 2016-05-03 ENCOUNTER — Telehealth: Payer: Self-pay

## 2016-05-03 NOTE — Telephone Encounter (Signed)
-----   Message from Lizbeth Bark, FNP sent at 05/03/2016  8:37 AM EDT ----- Fluid and electrolytes are normal. CBC lab is normal. Blood cells do not indicate any anemia or infection present. Kidney function normal Liver function normal

## 2016-05-03 NOTE — Telephone Encounter (Signed)
CMA call patient to go over lab results  Patient Verify DOB  Patient was aware and understood   

## 2016-05-04 ENCOUNTER — Ambulatory Visit (HOSPITAL_COMMUNITY)
Admission: RE | Admit: 2016-05-04 | Discharge: 2016-05-04 | Disposition: A | Discharge status: Home / Self Care | Payer: Self-pay | Source: Ambulatory Visit | Attending: Family Medicine | Admitting: Family Medicine

## 2016-05-04 ENCOUNTER — Ambulatory Visit (HOSPITAL_BASED_OUTPATIENT_CLINIC_OR_DEPARTMENT_OTHER)
Admission: RE | Admit: 2016-05-04 | Discharge: 2016-05-04 | Disposition: A | Discharge status: Home / Self Care | Payer: Self-pay | Source: Ambulatory Visit | Attending: Family Medicine | Admitting: Family Medicine

## 2016-05-04 DIAGNOSIS — Z87898 Personal history of other specified conditions: Secondary | ICD-10-CM

## 2016-05-04 DIAGNOSIS — I951 Orthostatic hypotension: Secondary | ICD-10-CM

## 2016-05-04 DIAGNOSIS — I081 Rheumatic disorders of both mitral and tricuspid valves: Secondary | ICD-10-CM | POA: Insufficient documentation

## 2016-05-04 LAB — VAS US CAROTID
LCCADSYS: -117 cm/s
LCCAPDIAS: 15 cm/s
LEFT ECA DIAS: -18 cm/s
LEFT VERTEBRAL DIAS: -9 cm/s
LICADDIAS: -31 cm/s
LICAPDIAS: -33 cm/s
Left CCA dist dias: -25 cm/s
Left CCA prox sys: 127 cm/s
Left ICA dist sys: -68 cm/s
Left ICA prox sys: -80 cm/s
RCCAPDIAS: 23 cm/s
RCCAPSYS: 101 cm/s
RIGHT ECA DIAS: -17 cm/s
RIGHT VERTEBRAL DIAS: -15 cm/s
Right cca dist sys: -64 cm/s

## 2016-05-04 NOTE — Progress Notes (Signed)
VASCULAR LAB PRELIMINARY  PRELIMINARY  PRELIMINARY  PRELIMINARY  Carotid duplex completed.    Preliminary report:  Bilateral - No evidence of extracranial ICA stenosis. Vertebral artery flow is antegrade.  Deklynn Charlet, RVS 05/04/2016, 9:49 AM

## 2016-05-04 NOTE — Progress Notes (Signed)
  Echocardiogram 2D Echocardiogram has been performed.  Zachary Krause 05/04/2016, 8:48 AM

## 2016-05-09 ENCOUNTER — Ambulatory Visit: Payer: Self-pay | Admitting: Cardiology

## 2016-05-10 ENCOUNTER — Telehealth: Payer: Self-pay

## 2016-05-10 NOTE — Telephone Encounter (Signed)
CMA call patient to let him know about his [place] results  Patient Verify DOB  Patient was aware and understood

## 2016-05-10 NOTE — Telephone Encounter (Signed)
-----   Message from Lizbeth Bark, FNP sent at 05/10/2016  1:25 PM EDT ----- No evidence of reduced blood flow or narrowing of the carotid arteries.  Follow up with cardiology referral.

## 2016-05-29 ENCOUNTER — Encounter: Payer: Self-pay | Admitting: Cardiology

## 2016-05-29 ENCOUNTER — Ambulatory Visit (INDEPENDENT_AMBULATORY_CARE_PROVIDER_SITE_OTHER): Payer: Self-pay | Admitting: Cardiology

## 2016-05-29 VITALS — BP 98/56 | HR 72 | Resp 16 | Ht 72.0 in | Wt 197.0 lb

## 2016-05-29 DIAGNOSIS — R002 Palpitations: Secondary | ICD-10-CM

## 2016-05-29 DIAGNOSIS — R55 Syncope and collapse: Secondary | ICD-10-CM

## 2016-05-29 NOTE — Progress Notes (Addendum)
05/29/2016 Zachary Krause   Aug 23, 1980  [id number]  Primary Physician Lizbeth BarkHairston, Mandesia R, FNP Primary Cardiologist: New (Dr. Eden EmmsNishan, DOD)   Reason for Visit/CC: New Patient Evaluation for Syncope and palpitations   HPI:  Zachary Krause is a 36 y.o. male who is being seen today, as a new patient, for the evaluation of syncope and palpitations at the request of Renella Cunasoel, Tiffany S, PA-C.  He has a h/o depression and anxiety and is followed by [facility]. Other than smoking, he has no other cardiac risk factors. No h/o HTN, HLD, DM or family h/o CAD or SCD. He has been a smoker for 20 years.   He was seen in the ED on 04/08/16 for syncope and palpitations. In ED, w/u was negative, including head CT, CXR, Troponin, UA, TSH, BMP and CBC. No anemia and no signs of dehydration. No hypoglycemia. ETOH level ok. UDS with cannabis only. EKG showed Normal sinus rhythm, rightward axis and early repolarization. Given negative w/u, he was discharged from the ED and instructed to f/u with [facility]. An outpatient echocardiogram and carotid doppler study were arranged. 2D echo was performed 05/04/16. EF was normal at 60-65% with normal wall motion. No AS or AI. Trivial MR and mild TR were both noted. No pericardial effusion. Bilateral carotid dopplers, also performed 05/04/16, were negative, w/o any evidence of extracranial ICA stenosis. He was seen by Scot Juniffany Noel, PA-C, at [facility], and referred to our practice for further w/u.   Pt reports that he has had 5 episodes of syncope over the last 10 years. However his recent visit to the ED was the first time he sought medical attention. He recalls that he was at a casino, in a hot and smokey room. He had had only 1 beer. While sitting and playing a slot machine, he suddenly had tachypalpitations, felt dizzy and noted the room seemed darker. He then passed out. When he awoke, he had hit his head against the machine and had a small lacertation underneath his right eye  and had busted his bottom lip. He was told the he was "out of it" for about 1 min. When he came to, he felt ok. No palpitations after he awoke. He also denied any chest pain.   Today in clinic, he denies any recurrent syncope/ near syncope, since his last event 04/08/16, however he continues to have occasional palpitations. He is currently asymptomatic. BP is 98/56. Physical exam is benign.    Current Meds  Medication Sig  . [DISCONTINUED] simethicone (GAS-X) 80 MG chewable tablet Chew 1 tablet (80 mg total) by mouth every 6 (six) hours as needed for flatulence.   Allergies  Allergen Reactions  . Pollen Extract Other (See Comments)    Seasonal allergies    Past Medical History:  Diagnosis Date  . Anxiety   . Depression   . MRSA (methicillin resistant staph aureus) culture positive    Family History  Problem Relation Age of Onset  . Hypertension Other   . Cancer Other    Past Surgical History:  Procedure Laterality Date  . HEMORRHOID SURGERY     Social History   Social History  . Marital status: Single    Spouse name: N/A  . Number of children: N/A  . Years of education: N/A   Occupational History  . Not on file.   Social History Main Topics  . Smoking status: Current Every Day Smoker    Packs/day: 0.50    Years: 13.00  Types: Cigarettes  . Smokeless tobacco: Never Used  . Alcohol use Yes     Comment: occ  . Drug use: Yes    Types: Marijuana  . Sexual activity: Yes    Birth control/ protection: Condom   Other Topics Concern  . Not on file   Social History Narrative  . No narrative on file     Review of Systems: General: negative for chills, fever, night sweats or weight changes.  Cardiovascular: negative for chest pain, dyspnea on exertion, edema, orthopnea, palpitations, paroxysmal nocturnal dyspnea or shortness of breath Dermatological: negative for rash Respiratory: negative for cough or wheezing Urologic: negative for hematuria Abdominal:  negative for nausea, vomiting, diarrhea, bright red blood per rectum, melena, or hematemesis Neurologic: negative for visual changes, syncope, or dizziness All other systems reviewed and are otherwise negative except as noted above.   Physical Exam:  Blood pressure (!) 98/56, pulse 72, resp. rate 16, height 6' (1.829 m), weight 197 lb (89.4 kg), SpO2 98 %.  General appearance: alert, cooperative and no distress Neck: no carotid bruit and no JVD Lungs: clear to auscultation bilaterally Heart: regular rate and rhythm, S1, S2 normal, no murmur, click, rub or gallop Extremities: extremities normal, atraumatic, no cyanosis or edema Pulses: 2+ and symmetric Skin: Skin color, texture, turgor normal. No rashes or lesions Neurologic: Grossly normal  EKG ED EKG 04/08/16 NSR with Right Axis and early repol -- personally reviewed   2D Echo 05/04/16 Study Conclusions  - Left ventricle: The cavity size was normal. Wall thickness was   normal. Systolic function was normal. The estimated ejection   fraction was in the range of 60% to 65%. Wall motion was normal;   there were no regional wall motion abnormalities. Left   ventricular diastolic function parameters were normal. - Mitral valve: There was trivial regurgitation. - Right ventricle: The cavity size was mildly dilated. Wall   thickness was normal. - Right atrium: The atrium was moderately dilated. - Tricuspid valve: There was mild regurgitation.  Bilateral Carotid Dopplers 05/04/16 Summary: Bilateral - No evidence of extracranial ICA stenosis. Vertebral artey flow is antegrade.  Other specific details can be found in the table(s) above. Prepared and Electronically Authenticated by  Leonides Sake, MD    ASSESSMENT AND PLAN:   1. Syncope/Palpitations: W/u in the ED was negative including head CT, CBC, BMP, troponin, CXR, UA, TSH, UDS and ETOH level. EKG showed NSR with Right axis and early repolarization. Outpatient 2D echo showed  normal LVEF, normal wall motion. No significant valvular abnormalities and no pericardial effusion. Bilateral carotid dopplers also normal. Exam is benign. Although no recurrent syncope, he has continued to have intermittent palpitations.  I've discussed plan with Dr. Eden Emms, DOD. We will have him wear a 30 day monitor to r/o significant cardiac arrhthymias. We will also arrange for an ETT to assess HR response to exercise and to r/o underlying coronary ischemia.    Follow-Up in 4-6 weeks after cardiac monitor and ETT.   Novah Nessel Delmer Islam, MHS [facility] 05/29/2016 9:47 AM

## 2016-05-29 NOTE — Patient Instructions (Signed)
Medication Instructions:   Your physician recommends that you continue on your current medications as directed. Please refer to the Current Medication list given to you today.   If you need a refill on your cardiac medications before your next appointment, please call your pharmacy.  Labwork: NONE ORDERED  TODAY    Testing/Procedures: Your physician has recommended that you wear an event monitor. Event monitors are medical devices that record the heart's electrical activity. Doctors most often us these monitors to diagnose arrhythmias. Arrhythmias are problems with the speed or rhythm of the heartbeat. The monitor is a small, portable device. You can wear one while you do your normal daily activities. This is usually used to diagnose what is causing palpitations/syncope (passing out).  Your physician has requested that you have an exercise tolerance test. For further information please visit [email or web address]. Please also follow instruction sheet, as given.    Follow-Up: 5 TO 6 WEEKS  WITH SIMMONS AFTER MONITOR AND GXT   Any Other Special Instructions Will Be Listed Below (If Applicable).

## 2016-07-05 ENCOUNTER — Ambulatory Visit (INDEPENDENT_AMBULATORY_CARE_PROVIDER_SITE_OTHER): Payer: Self-pay

## 2016-07-05 DIAGNOSIS — R002 Palpitations: Secondary | ICD-10-CM

## 2016-07-05 DIAGNOSIS — R55 Syncope and collapse: Secondary | ICD-10-CM

## 2016-07-05 LAB — EXERCISE TOLERANCE TEST
CHL CUP STRESS STAGE 1 DBP: 69 mmHg
CHL CUP STRESS STAGE 1 GRADE: 0 %
CHL CUP STRESS STAGE 1 HR: 81 {beats}/min
CHL CUP STRESS STAGE 1 SPEED: 0 mph
CHL CUP STRESS STAGE 10 DBP: 55 mmHg
CHL CUP STRESS STAGE 10 HR: 99 {beats}/min
CHL CUP STRESS STAGE 10 SBP: 152 mmHg
CHL CUP STRESS STAGE 10 SPEED: 0 mph
CHL CUP STRESS STAGE 2 HR: 84 {beats}/min
CHL CUP STRESS STAGE 2 SPEED: 0 mph
CHL CUP STRESS STAGE 3 GRADE: 0 %
CHL CUP STRESS STAGE 4 GRADE: 0 %
CHL CUP STRESS STAGE 4 SPEED: 1 mph
CHL CUP STRESS STAGE 5 DBP: 82 mmHg
CHL CUP STRESS STAGE 5 GRADE: 10 %
CHL CUP STRESS STAGE 5 HR: 102 {beats}/min
CHL CUP STRESS STAGE 5 SBP: 170 mmHg
CHL CUP STRESS STAGE 5 SPEED: 1.7 mph
CHL CUP STRESS STAGE 6 DBP: 81 mmHg
CHL CUP STRESS STAGE 6 HR: 121 {beats}/min
CHL CUP STRESS STAGE 6 SBP: 202 mmHg
CHL CUP STRESS STAGE 7 DBP: 87 mmHg
CHL CUP STRESS STAGE 7 HR: 151 {beats}/min
CHL CUP STRESS STAGE 8 SPEED: 4.1 mph
CHL CUP STRESS STAGE 9 SBP: 197 mmHg
CHL CUP STRESS STAGE 9 SPEED: 1.5 mph
Estimated workload: 12.7 METS
Exercise duration (min): 11 min
Exercise duration (sec): 0 s
MPHR: 182 {beats}/min
Peak HR: 169 {beats}/min
Percent HR: 92 %
Percent of predicted max HR: 92 %
RPE: 19
Rest HR: 79 {beats}/min
Stage 1 SBP: 137 mmHg
Stage 10 Grade: 0 %
Stage 2 Grade: 0 %
Stage 3 HR: 90 {beats}/min
Stage 3 Speed: 1 mph
Stage 4 HR: 90 {beats}/min
Stage 6 Grade: 12 %
Stage 6 Speed: 2.5 mph
Stage 7 Grade: 14 %
Stage 7 SBP: 194 mmHg
Stage 7 Speed: 3.4 mph
Stage 8 Grade: 16 %
Stage 8 HR: 169 {beats}/min
Stage 9 DBP: 79 mmHg
Stage 9 Grade: 0 %
Stage 9 HR: 155 {beats}/min

## 2016-07-06 ENCOUNTER — Encounter: Payer: Self-pay | Admitting: Family Medicine

## 2016-07-06 ENCOUNTER — Ambulatory Visit: Payer: Self-pay | Attending: Family Medicine | Admitting: Family Medicine

## 2016-07-06 ENCOUNTER — Ambulatory Visit: Payer: Self-pay | Attending: Family Medicine | Admitting: Licensed Clinical Social Worker

## 2016-07-06 VITALS — BP 102/65 | HR 72 | Temp 97.9°F | Resp 18 | Ht 72.0 in | Wt 181.6 lb

## 2016-07-06 DIAGNOSIS — F411 Generalized anxiety disorder: Secondary | ICD-10-CM | POA: Insufficient documentation

## 2016-07-06 DIAGNOSIS — G44209 Tension-type headache, unspecified, not intractable: Secondary | ICD-10-CM | POA: Insufficient documentation

## 2016-07-06 DIAGNOSIS — F4010 Social phobia, unspecified: Secondary | ICD-10-CM | POA: Insufficient documentation

## 2016-07-06 DIAGNOSIS — F419 Anxiety disorder, unspecified: Secondary | ICD-10-CM

## 2016-07-06 DIAGNOSIS — F329 Major depressive disorder, single episode, unspecified: Secondary | ICD-10-CM | POA: Insufficient documentation

## 2016-07-06 DIAGNOSIS — F418 Other specified anxiety disorders: Secondary | ICD-10-CM

## 2016-07-06 DIAGNOSIS — R0602 Shortness of breath: Secondary | ICD-10-CM | POA: Insufficient documentation

## 2016-07-06 MED ORDER — FLUOXETINE HCL 20 MG PO CAPS: 20.0000 mg | ORAL_CAPSULE | Freq: Every day | ORAL | 1 refills | Status: DC

## 2016-07-06 MED ORDER — IBUPROFEN 600 MG PO TABS: 600.0000 mg | ORAL_TABLET | Freq: Three times a day (TID) | ORAL | 1 refills | Status: DC | PRN

## 2016-07-06 MED FILL — IBUPROFEN 600 MG TABLET: 600 | 10 days supply | Qty: 30 | Fill #0

## 2016-07-06 MED FILL — ?FLUOXETINE HCL 20 MG CAP: 20 | 40 days supply | Qty: 40 | Fill #0

## 2016-07-06 NOTE — Progress Notes (Signed)
Subjective:  Patient ID: Zachary Krause, male    DOB: [birth year]  Age: 36 y.o. MRN: [id number]  CC: Establish Care   HPI Zachary Krause presents for anxiety.  He has the following symptoms: difficulty concentrating, feelings of losing control, palpitations, racing thoughts, shortness of breath. Reports symptoms started 10  years ago, gradually worsening since that time. He denies current suicidal and homicidal ideation. Previous treatment 10 years ago reports using citalopram and reports receiving counseling services for anxiety but stopped use because symptoms improved. He reports headaches for 2 weeks. Left sided headache pain and occasionally radiates to his left ear. Denies any visual disturbances, N/V, or tinnitus.      No outpatient prescriptions prior to visit.   No facility-administered medications prior to visit.     ROS Review of Systems  Constitutional: Negative.   HENT: Negative.   Eyes: Negative.   Respiratory: Negative.   Cardiovascular: Negative.   Neurological: Positive for headaches.  Psychiatric/Behavioral: The patient is nervous/anxious.     Objective:  BP 102/65 (BP Location: Left Arm, Patient Position: Sitting, Cuff Size: Normal)   Pulse 72   Temp 97.9 F (36.6 C) (Oral)   Resp 18   Ht 6' (1.829 m)   Wt 181 lb 9.6 oz (82.4 kg)   SpO2 100%   BMI 24.63 kg/m   BP/Weight 07/06/2016 05/29/2016 04/28/2016  Systolic BP 102 98 125  Diastolic BP 65 56 74  Wt. (Lbs) 181.6 197 192  BMI 24.63 26.72 26.04     Physical Exam  Constitutional: He is oriented to person, place, and time. He appears well-developed and well-nourished.  HENT:  Head: Normocephalic and atraumatic.  Right Ear: External ear normal.  Left Ear: External ear normal.  Nose: Nose normal.  Mouth/Throat: Oropharynx is clear and moist.  Eyes: Conjunctivae are normal. Pupils are equal, round, and reactive to light.  Neck: No JVD present.  Cardiovascular: Normal rate, regular rhythm, normal  heart sounds and intact distal pulses.   Pulmonary/Chest: Effort normal and breath sounds normal.  Abdominal: Soft. Bowel sounds are normal.  Musculoskeletal: Normal range of motion.  Neurological: He is alert and oriented to person, place, and time.  Skin: Skin is warm and dry.  Psychiatric: He has a normal mood and affect.  Nursing note and vitals reviewed.  Assessment & Plan:   Problem List Items Addressed This Visit    None    Visit Diagnoses    Generalized anxiety disorder    -  Primary   LCSW spoke with patient and provided resources   Relevant Medications   FLUoxetine (PROZAC) 20 MG capsule   Other Relevant Orders   Ambulatory referral to Psychiatry   Tension headache       Relevant Medications   ibuprofen (ADVIL,MOTRIN) 600 MG tablet   FLUoxetine (PROZAC) 20 MG capsule   Social anxiety disorder       Relevant Medications   FLUoxetine (PROZAC) 20 MG capsule   Other Relevant Orders   Ambulatory referral to Psychiatry   Anxiety with depression       Relevant Medications   FLUoxetine (PROZAC) 20 MG capsule   Other Relevant Orders   Ambulatory referral to Psychiatry      Meds ordered this encounter  Medications  . ibuprofen (ADVIL,MOTRIN) 600 MG tablet    Sig: Take 1 tablet (600 mg total) by mouth every 8 (eight) hours as needed for headache (Take with food.).    Dispense:  30 tablet  Refill:  1    Order Specific Question:   Supervising Provider    Answer:   Quentin AngstJEGEDE, OLUGBEMIGA E [id number]]  . FLUoxetine (PROZAC) 20 MG capsule    Sig: Take 1 capsule (20 mg total) by mouth daily.    Dispense:  40 capsule    Refill:  1    Order Specific Question:   Supervising Provider    Answer:   Quentin AngstJEGEDE, OLUGBEMIGA E [[id number]]    Follow-up: Return in about 8 weeks (around 08/31/2016) for GAD/ Depression.   Lizbeth BarkMandesia R Cong Hightower FNP

## 2016-07-06 NOTE — Patient Instructions (Signed)
Fluoxetine capsules or tablets (Depression/Mood Disorders) What is this medicine? FLUOXETINE (floo OX e teen) belongs to a class of drugs known as selective serotonin reuptake inhibitors (SSRIs). It helps to treat mood problems such as depression, obsessive compulsive disorder, and panic attacks. It can also treat certain eating disorders. This medicine may be used for other purposes; ask your health care provider or pharmacist if you have questions. COMMON BRAND NAME(S): Prozac What should I tell my health care provider before I take this medicine? They need to know if you have any of these conditions: -bipolar disorder or a family history of bipolar disorder -bleeding disorders -glaucoma -heart disease -liver disease -low levels of sodium in the blood -seizures -suicidal thoughts, plans, or attempt; a previous suicide attempt by you or a family member -take MAOIs like Carbex, Eldepryl, Marplan, Nardil, and Parnate -take medicines that treat or prevent blood clots -thyroid disease -an unusual or allergic reaction to fluoxetine, other medicines, foods, dyes, or preservatives -pregnant or trying to get pregnant -breast-feeding How should I use this medicine? Take this medicine by mouth with a glass of water. Follow the directions on the prescription label. You can take this medicine with or without food. Take your medicine at regular intervals. Do not take it more often than directed. Do not stop taking this medicine suddenly except upon the advice of your doctor. Stopping this medicine too quickly may cause serious side effects or your condition may worsen. A special MedGuide will be given to you by the pharmacist with each prescription and refill. Be sure to read this information carefully each time. Talk to your pediatrician regarding the use of this medicine in children. While this drug may be prescribed for children as young as 7 years for selected conditions, precautions do  apply. Overdosage: If you think you have taken too much of this medicine contact a poison control center or emergency room at once. NOTE: This medicine is only for you. Do not share this medicine with others. What if I miss a dose? If you miss a dose, skip the missed dose and go back to your regular dosing schedule. Do not take double or extra doses. What may interact with this medicine? Do not take this medicine with any of the following medications: -other medicines containing fluoxetine, like Sarafem or Symbyax -cisapride -linezolid -MAOIs like Carbex, Eldepryl, Marplan, Nardil, and Parnate -methylene blue (injected into a vein) -pimozide -thioridazine This medicine may also interact with the following medications: -alcohol -amphetamines -aspirin and aspirin-like medicines -carbamazepine -certain medicines for depression, anxiety, or psychotic disturbances -certain medicines for migraine headaches like almotriptan, eletriptan, frovatriptan, naratriptan, rizatriptan, sumatriptan, zolmitriptan -digoxin -diuretics -fentanyl -flecainide -furazolidone -isoniazid -lithium -medicines for sleep -medicines that treat or prevent blood clots like warfarin, enoxaparin, and dalteparin -NSAIDs, medicines for pain and inflammation, like ibuprofen or naproxen -phenytoin -procarbazine -propafenone -rasagiline -ritonavir -supplements like St. John's wort, kava kava, valerian -tramadol -tryptophan -vinblastine This list may not describe all possible interactions. Give your health care provider a list of all the medicines, herbs, non-prescription drugs, or dietary supplements you use. Also tell them if you smoke, drink alcohol, or use illegal drugs. Some items may interact with your medicine. What should I watch for while using this medicine? Tell your doctor if your symptoms do not get better or if they get worse. Visit your doctor or health care professional for regular checks on your  progress. Because it may take several weeks to see the full effects of this medicine, it   is important to continue your treatment as prescribed by your doctor. Patients and their families should watch out for new or worsening thoughts of suicide or depression. Also watch out for sudden changes in feelings such as feeling anxious, agitated, panicky, irritable, hostile, aggressive, impulsive, severely restless, overly excited and hyperactive, or not being able to sleep. If this happens, especially at the beginning of treatment or after a change in dose, call your health care professional. Bonita Quin may get drowsy or dizzy. Do not drive, use machinery, or do anything that needs mental alertness until you know how this medicine affects you. Do not stand or sit up quickly, especially if you are an older patient. This reduces the risk of dizzy or fainting spells. Alcohol may interfere with the effect of this medicine. Avoid alcoholic drinks. Your mouth may get dry. Chewing sugarless gum or sucking hard candy, and drinking plenty of water may help. Contact your doctor if the problem does not go away or is severe. This medicine may affect blood sugar levels. If you have diabetes, check with your doctor or health care professional before you change your diet or the dose of your diabetic medicine. What side effects may I notice from receiving this medicine? Side effects that you should report to your doctor or health care professional as soon as possible: -allergic reactions like skin rash, itching or hives, swelling of the face, lips, or tongue -anxious -black, tarry stools -breathing problems -changes in vision -confusion -elevated mood, decreased need for sleep, racing thoughts, impulsive behavior -eye pain -fast, irregular heartbeat -feeling faint or lightheaded, falls -feeling agitated, angry, or irritable -hallucination, loss of contact with reality -loss of balance or coordination -loss of memory -painful  or prolonged erections -restlessness, pacing, inability to keep still -seizures -stiff muscles -suicidal thoughts or other mood changes -trouble sleeping -unusual bleeding or bruising -unusually weak or tired -vomiting Side effects that usually do not require medical attention (report to your doctor or health care professional if they continue or are bothersome): -change in appetite or weight -change in sex drive or performance -diarrhea -dry mouth -headache -increased sweating -nausea -tremors This list may not describe all possible side effects. Call your doctor for medical advice about side effects. You may report side effects to FDA at 1-800-FDA-1088. Where should I keep my medicine? Keep out of the reach of children. Store at room temperature between 15 and 30 degrees C (59 and 86 degrees F). Throw away any unused medicine after the expiration date. NOTE: This sheet is a summary. It may not cover all possible information. If you have questions about this medicine, talk to your doctor, pharmacist, or health care provider.  2018 Elsevier/Gold Standard (2015-06-05 15:55:27) Living With Depression Everyone experiences occasional disappointment, sadness, and loss in their lives. When you are feeling down, blue, or sad for at least 2 weeks in a row, it may mean that you have depression. Depression can affect your thoughts and feelings, relationships, daily activities, and physical health. It is caused by changes in the way your brain functions. If you receive a diagnosis of depression, your health care provider will tell you which type of depression you have and what treatment options are available to you. If you are living with depression, there are ways to help you recover from it and also ways to prevent it from coming back. How to cope with lifestyle changes Coping with stress Stress is your body's reaction to life changes and events, both good and bad. Stressful  situations may  include:  Getting married.  The death of a spouse.  Losing a job.  Retiring.  Having a baby.  Stress can last just a few hours or it can be ongoing. Stress can play a major role in depression, so it is important to learn both how to cope with stress and how to think about it differently. Talk with your health care provider or a counselor if you would like to learn more about stress reduction. He or she may suggest some stress reduction techniques, such as:  Music therapy. This can include creating music or listening to music. Choose music that you enjoy and that inspires you.  Mindfulness-based meditation. This kind of meditation can be done while sitting or walking. It involves being aware of your normal breaths, rather than trying to control your breathing.  Centering prayer. This is a kind of meditation that involves focusing on a spiritual word or phrase. Choose a word, phrase, or sacred image that is meaningful to you and that brings you peace.  Deep breathing. To do this, expand your stomach and inhale slowly through your nose. Hold your breath for 3-5 seconds, then exhale slowly, allowing your stomach muscles to relax.  Muscle relaxation. This involves intentionally tensing muscles then relaxing them.  Choose a stress reduction technique that fits your lifestyle and personality. Stress reduction techniques take time and practice to develop. Set aside 5-15 minutes a day to do them. Therapists can offer training in these techniques. The training may be covered by some insurance plans. Other things you can do to manage stress include:  Keeping a stress diary. This can help you learn what triggers your stress and ways to control your response.  Understanding what your limits are and saying no to requests or events that lead to a schedule that is too full.  Thinking about how you respond to certain situations. You may not be able to control everything, but you can control how you  react.  Adding humor to your life by watching funny films or TV shows.  Making time for activities that help you relax and not feeling guilty about spending your time this way.  Medicines Your health care provider may suggest certain medicines if he or she feels that they will help improve your condition. Avoid using alcohol and other substances that may prevent your medicines from working properly (may interact). It is also important to:  Talk with your pharmacist or health care provider about all the medicines that you take, their possible side effects, and what medicines are safe to take together.  Make it your goal to take part in all treatment decisions (shared decision-making). This includes giving input on the side effects of medicines. It is best if shared decision-making with your health care provider is part of your total treatment plan.  If your health care provider prescribes a medicine, you may not notice the full benefits of it for 4-8 weeks. Most people who are treated for depression need to be on medicine for at least 6-12 months after they feel better. If you are taking medicines as part of your treatment, do not stop taking medicines without first talking to your health care provider. You may need to have the medicine slowly decreased (tapered) over time to decrease the risk of harmful side effects. Relationships Your health care provider may suggest family therapy along with individual therapy and drug therapy. While there may not be family problems that are causing you to feel  depressed, it is still important to make sure your family learns as much as they can about your mental health. Having your family's support can help make your treatment successful. How to recognize changes in your condition Everyone has a different response to treatment for depression. Recovery from major depression happens when you have not had signs of major depression for two months. This may mean that  you will start to:  Have more interest in doing activities.  Feel less hopeless than you did 2 months ago.  Have more energy.  Overeat less often, or have better or improving appetite.  Have better concentration.  Your health care provider will work with you to decide the next steps in your recovery. It is also important to recognize when your condition is getting worse. Watch for these signs:  Having fatigue or low energy.  Eating too much or too little.  Sleeping too much or too little.  Feeling restless, agitated, or hopeless.  Having trouble concentrating or making decisions.  Having unexplained physical complaints.  Feeling irritable, angry, or aggressive.  Get help as soon as you or your family members notice these symptoms coming back. How to get support and help from others How to talk with friends and family members about your condition Talking to friends and family members about your condition can provide you with one way to get support and guidance. Reach out to trusted friends or family members, explain your symptoms to them, and let them know that you are working with a health care provider to treat your depression. Financial resources Not all insurance plans cover mental health care, so it is important to check with your insurance carrier. If paying for co-pays or counseling services is a problem, search for a local or county mental health care center. They may be able to offer public mental health care services at low or no cost when you are not able to see a private health care provider. If you are taking medicine for depression, you may be able to get the generic form, which may be less expensive. Some makers of prescription medicines also offer help to patients who cannot afford the medicines they need. Follow these instructions at home:  Get the right amount and quality of sleep.  Cut down on using caffeine, tobacco, alcohol, and other potentially harmful  substances.  Try to exercise, such as walking or lifting small weights.  Take over-the-counter and prescription medicines only as told by your health care provider.  Eat a healthy diet that includes plenty of vegetables, fruits, whole grains, low-fat dairy products, and lean protein. Do not eat a lot of foods that are high in solid fats, added sugars, or salt.  Keep all follow-up visits as told by your health care provider. This is important. Contact a health care provider if:  You stop taking your antidepressant medicines, and you have any of these symptoms: ? Nausea. ? Headache. ? Feeling lightheaded. ? Chills and body aches. ? Not being able to sleep (insomnia).  You or your friends and family think your depression is getting worse. Get help right away if:  You have thoughts of hurting yourself or others. If you ever feel like you may hurt yourself or others, or have thoughts about taking your own life, get help right away. You can go to your nearest emergency department or call:  Your local emergency services (911 in the [place]  A suicide crisis helpline, such as the National Suicide Prevention Lifeline at  (825)556-8594. This is open 24-hours a day.  Summary  If you are living with depression, there are ways to help you recover from it and also ways to prevent it from coming back.  Work with your health care team to create a management plan that includes counseling, stress management techniques, and healthy lifestyle habits. This information is not intended to replace advice given to you by your health care provider. Make sure you discuss any questions you have with your health care provider. Document Released: 12/06/2015 Document Revised: 12/06/2015 Document Reviewed: 12/06/2015 Elsevier Interactive Patient Education  2018 [employer] Generalized Anxiety Disorder, Adult Generalized anxiety disorder (GAD) is a mental health disorder. People with this condition constantly  worry about everyday events. Unlike normal anxiety, worry related to GAD is not triggered by a specific event. These worries also do not fade or get better with time. GAD interferes with life functions, including relationships, work, and school. GAD can vary from mild to severe. People with severe GAD can have intense waves of anxiety with physical symptoms (panic attacks). What are the causes? The exact cause of GAD is not known. What increases the risk? This condition is more likely to develop in:  Women.  People who have a family history of anxiety disorders.  People who are very shy.  People who experience very stressful life events, such as the death of a loved one.  People who have a very stressful family environment.  What are the signs or symptoms? People with GAD often worry excessively about many things in their lives, such as their health and family. They may also be overly concerned about:  Doing well at work.  Being on time.  Natural disasters.  Friendships.  Physical symptoms of GAD include:  Fatigue.  Muscle tension or having muscle twitches.  Trembling or feeling shaky.  Being easily startled.  Feeling like your heart is pounding or racing.  Feeling out of breath or like you cannot take a deep breath.  Having trouble falling asleep or staying asleep.  Sweating.  Nausea, diarrhea, or irritable bowel syndrome (IBS).  Headaches.  Trouble concentrating or remembering facts.  Restlessness.  Irritability.  How is this diagnosed? Your health care provider can diagnose GAD based on your symptoms and medical history. You will also have a physical exam. The health care provider will ask specific questions about your symptoms, including how severe they are, when they started, and if they come and go. Your health care provider may ask you about your use of alcohol or drugs, including prescription medicines. Your health care provider may refer you to a  mental health specialist for further evaluation. Your health care provider will do a thorough examination and may perform additional tests to rule out other possible causes of your symptoms. To be diagnosed with GAD, a person must have anxiety that:  Is out of his or her control.  Affects several different aspects of his or her life, such as work and relationships.  Causes distress that makes him or her unable to take part in normal activities.  Includes at least three physical symptoms of GAD, such as restlessness, fatigue, trouble concentrating, irritability, muscle tension, or sleep problems.  Before your health care provider can confirm a diagnosis of GAD, these symptoms must be present more days than they are not, and they must last for six months or longer. How is this treated? The following therapies are usually used to treat GAD:  Medicine. Antidepressant medicine is usually prescribed  for long-term daily control. Antianxiety medicines may be added in severe cases, especially when panic attacks occur.  Talk therapy (psychotherapy). Certain types of talk therapy can be helpful in treating GAD by providing support, education, and guidance. Options include: ? Cognitive behavioral therapy (CBT). People learn coping skills and techniques to ease their anxiety. They learn to identify unrealistic or negative thoughts and behaviors and to replace them with positive ones. ? Acceptance and commitment therapy (ACT). This treatment teaches people how to be mindful as a way to cope with unwanted thoughts and feelings. ? Biofeedback. This process trains you to manage your body's response (physiological response) through breathing techniques and relaxation methods. You will work with a therapist while machines are used to monitor your physical symptoms.  Stress management techniques. These include yoga, meditation, and exercise.  A mental health specialist can help determine which treatment is best  for you. Some people see improvement with one type of therapy. However, other people require a combination of therapies. Follow these instructions at home:  Take over-the-counter and prescription medicines only as told by your health care provider.  Try to maintain a normal routine.  Try to anticipate stressful situations and allow extra time to manage them.  Practice any stress management or self-calming techniques as taught by your health care provider.  Do not punish yourself for setbacks or for not making progress.  Try to recognize your accomplishments, even if they are small.  Keep all follow-up visits as told by your health care provider. This is important. Contact a health care provider if:  Your symptoms do not get better.  Your symptoms get worse.  You have signs of depression, such as: ? A persistently sad, cranky, or irritable mood. ? Loss of enjoyment in activities that used to bring you joy. ? Change in weight or eating. ? Changes in sleeping habits. ? Avoiding friends or family members. ? Loss of energy for normal tasks. ? Feelings of guilt or worthlessness. Get help right away if:  You have serious thoughts about hurting yourself or others. If you ever feel like you may hurt yourself or others, or have thoughts about taking your own life, get help right away. You can go to your nearest emergency department or call:  Your local emergency services (911 in the [place]  A suicide crisis helpline, such as the National Suicide Prevention Lifeline at 469-010-8608. This is open 24 hours a day.  Summary  Generalized anxiety disorder (GAD) is a mental health disorder that involves worry that is not triggered by a specific event.  People with GAD often worry excessively about many things in their lives, such as their health and family.  GAD may cause physical symptoms such as restlessness, trouble concentrating, sleep problems, frequent sweating, nausea, diarrhea,  headaches, and trembling or muscle twitching.  A mental health specialist can help determine which treatment is best for you. Some people see improvement with one type of therapy. However, other people require a combination of therapies. This information is not intended to replace advice given to you by your health care provider. Make sure you discuss any questions you have with your health care provider. Document Released: 04/29/2012 Document Revised: 11/23/2015 Document Reviewed: 11/23/2015 Elsevier Interactive Patient Education  [employer].

## 2016-07-06 NOTE — Progress Notes (Signed)
Patient is here for anxiety/ pain attack   Migraines on left side everyday going through the ear

## 2016-07-07 NOTE — BH Specialist Note (Signed)
Integrated Behavioral Health Initial Visit  MRN: [id number] Name: Zachary Krause   Session Start time: 11:00 AM Session End time: 11:20 AM Total time: 20 minutes  Type of Service: Integrated Behavioral Health- Individual/Family Interpretor:No. Interpretor Name and Language: N/A   Warm Hand Off Completed.       SUBJECTIVE: Zachary MannanJerry Mczeal is a 36 y.o. male accompanied by patient. Patient was referred by FNP Hairston for anxiety. Patient reports the following symptoms/concerns: low energy, difficulty concentrating, excessive worrying, panic attacks, and irritability Duration of problem: Ongoing, Per pt, was diagnosed with social anxiety ten years ago; Severity of problem: moderate  OBJECTIVE: Mood: Anxious and Affect: Appropriate Risk of harm to self or others: No plan to harm self or others   LIFE CONTEXT: Family and Social: Pt is currently separated from spouse. He is residing with family School/Work: Pt is unemployed, states he is unable to maintain employment. Pt has applied for disability Self-Care: Pt participates in medication management.  Life Changes: Pt recently separated from spouse and has financial strain due to inability to work  GOALS ADDRESSED: Patient will reduce symptoms of: anxiety and increase knowledge and/or ability of: coping skills and also: Increase adequate support systems for patient/family and Improve medication compliance   INTERVENTIONS: Solution-Focused Strategies, Mindfulness or Relaxation Training, Supportive Counseling, Psychoeducation and/or Health Education and Link to [employer]  Standardized Assessments completed: PHQ 2&9  ASSESSMENT: Patient currently experiencing anxiety triggered by marital conflict and financial strain. He reports low energy, difficulty concentrating, excessive worrying, panic attacks, and irritability. Patient may benefit from psychoeducation and psychotherapy. He participates in medication management through PCP.  [facility] educated pt on anxiety, panic attacks, and how stress can negatively impact one's mental and physical health. LCSWA discussed benefits of applying healthy coping skills to decrease symptoms. Pt was successful in identifying healthy strategies to utilize on a routine basis. Pt was provided resources on emotional well-being apps, psychotherapy, and crisis intervention.   PLAN: 1. Follow up with behavioral health clinician on : Pt was encouraged to contact [facility] if symptoms worsen or fail to improve to [facility] 2. Behavioral recommendations: [facility] recommends that pt apply healthy coping skills discussed. Pt is encouraged to schedule follow up appointment with [facility] 3. Referral(s): Community Mental Health Services (LME/Outside Clinic) 4. "From scale of 1-10, how likely are you to follow plan?": 7/10  Bridgett LarssonJasmine D Lewis, LCSW 07/07/16 4:47 PM

## 2016-07-13 ENCOUNTER — Ambulatory Visit: Payer: Self-pay | Admitting: Cardiology

## 2016-07-24 ENCOUNTER — Encounter: Payer: Self-pay | Admitting: Cardiology

## 2016-07-26 ENCOUNTER — Encounter: Payer: Self-pay | Admitting: Family Medicine

## 2016-07-26 ENCOUNTER — Ambulatory Visit: Payer: Self-pay | Attending: Family Medicine | Admitting: Licensed Clinical Social Worker

## 2016-07-26 ENCOUNTER — Ambulatory Visit: Payer: Self-pay | Attending: Family Medicine | Admitting: Family Medicine

## 2016-07-26 VITALS — BP 119/68 | HR 85 | Temp 98.5°F | Resp 18 | Ht 72.0 in | Wt 182.2 lb

## 2016-07-26 DIAGNOSIS — F329 Major depressive disorder, single episode, unspecified: Secondary | ICD-10-CM

## 2016-07-26 DIAGNOSIS — F32A Depression, unspecified: Secondary | ICD-10-CM

## 2016-07-26 DIAGNOSIS — Z79899 Other long term (current) drug therapy: Secondary | ICD-10-CM | POA: Insufficient documentation

## 2016-07-26 DIAGNOSIS — F99 Mental disorder, not otherwise specified: Secondary | ICD-10-CM

## 2016-07-26 DIAGNOSIS — F419 Anxiety disorder, unspecified: Secondary | ICD-10-CM

## 2016-07-26 DIAGNOSIS — T887XXA Unspecified adverse effect of drug or medicament, initial encounter: Secondary | ICD-10-CM

## 2016-07-26 DIAGNOSIS — G4700 Insomnia, unspecified: Secondary | ICD-10-CM | POA: Insufficient documentation

## 2016-07-26 DIAGNOSIS — R0602 Shortness of breath: Secondary | ICD-10-CM | POA: Insufficient documentation

## 2016-07-26 DIAGNOSIS — F5105 Insomnia due to other mental disorder: Secondary | ICD-10-CM

## 2016-07-26 DIAGNOSIS — R002 Palpitations: Secondary | ICD-10-CM | POA: Insufficient documentation

## 2016-07-26 MED ORDER — TRAZODONE HCL 50 MG PO TABS
25.0000 mg | ORAL_TABLET | Freq: Every evening | ORAL | 0 refills | Status: DC | PRN
Start: 2016-07-26 — End: 2016-11-13

## 2016-07-26 MED ORDER — ESCITALOPRAM OXALATE 20 MG PO TABS: 20.0000 mg | ORAL_TABLET | Freq: Every day | ORAL | 1 refills | Status: AC

## 2016-07-26 MED FILL — traZODone HCL 50 MG TABS: 50 | 30 days supply | Qty: 30 | Fill #0

## 2016-07-26 MED FILL — ESCITALOPRAM 20 MG TABLET: 20 | 30 days supply | Qty: 30 | Fill #0

## 2016-07-26 NOTE — BH Specialist Note (Signed)
Integrated Behavioral Health Follow Up Visit  MRN: [id number] Name: Zachary Krause   Session Start time: 9:45 AM Session End time: 10:05 AM Total time: 20 minutes Number of Integrated Behavioral Health Clinician visits: 2/10  Type of Service: Integrated Behavioral Health- Individual/Family Interpretor:No. Interpretor Name and Language: N/A   Warm Hand Off Completed.       SUBJECTIVE: Zachary Krause is a 36 y.o. male accompanied by patient. Patient was referred by FNP Hairston for anxiety and depression. Patient reports the following symptoms/concerns: feelings of sadness and being a failure, low energy, difficulty concentrating, excessive worrying, panic attacks, and irritability Duration of problem: Ongoing; Severity of problem: moderate  OBJECTIVE: Mood: Dysphoric and Affect: Depressed Risk of harm to self or others: No plan to harm self or others   LIFE CONTEXT: Family and Social: Pt is currently separated from spouse. He is residing with family School/Work: Pt is unemployed, states he is unable to maintain employment. Pt has applied for disability Self-Care: Pt participates in medication management.  Life Changes: Pt recently separated from spouse and has financial strain due to inability to work  GOALS ADDRESSED: Patient will reduce symptoms of: anxiety and depression and increase knowledge and/or ability of: coping skills and also: Increase adequate support systems for patient/family and Improve medication compliance  INTERVENTIONS: Solution-Focused Strategies, Supportive Counseling and Link to [employer] Standardized Assessments completed: PHQ 2&9  ASSESSMENT: Patient currently experiencing anxiety triggered by marital conflict and financial strain. He reports feelings of sadness and being a failure, low energy, difficulty concentrating, excessive worrying, panic attacks, and irritability. Patient may benefit from psychotherapy. He participates in medication  management through PCP reporting a slight improvement in anxiety "at times". Patient states that he has recently obtained a disability lawyer and is in need of money. [facility] provided labor resources. He states that he is unable to work due to anxiety; however, was open to resources.   PLAN: 1. Follow up with behavioral health clinician on : Pt was encouraged tocontact [facility] if symptoms worsen or fail to improveto schedule behavioral appointments [facility]. 2. Behavioral recommendations: LCSWA recommends that pt apply healthy coping skills discussed and utilize provided resources. Pt is encouraged to schedule follow up appointment with [facility] 3. Referral(s): Community Resources:  Finances 4. "From scale of 1-10, how likely are you to follow plan?": 7/10  Bridgett LarssonJasmine D Lewis, LCSW 07/27/16 9:01 AM

## 2016-07-26 NOTE — Patient Instructions (Signed)
Follow up with psychiatrist referral.   Living With Anxiety After being diagnosed with an anxiety disorder, you may be relieved to know why you have felt or behaved a certain way. It is natural to also feel overwhelmed about the treatment ahead and what it will mean for your life. With care and support, you can manage this condition and recover from it. How to cope with anxiety Dealing with stress Stress is your body's reaction to life changes and events, both good and bad. Stress can last just a few hours or it can be ongoing. Stress can play a major role in anxiety, so it is important to learn both how to cope with stress and how to think about it differently. Talk with your health care provider or a counselor to learn more about stress reduction. He or she may suggest some stress reduction techniques, such as:  Music therapy. This can include creating or listening to music that you enjoy and that inspires you.  Mindfulness-based meditation. This involves being aware of your normal breaths, rather than trying to control your breathing. It can be done while sitting or walking.  Centering prayer. This is a kind of meditation that involves focusing on a word, phrase, or sacred image that is meaningful to you and that brings you peace.  Deep breathing. To do this, expand your stomach and inhale slowly through your nose. Hold your breath for 3-5 seconds. Then exhale slowly, allowing your stomach muscles to relax.  Self-talk. This is a skill where you identify thought patterns that lead to anxiety reactions and correct those thoughts.  Muscle relaxation. This involves tensing muscles then relaxing them.  Choose a stress reduction technique that fits your lifestyle and personality. Stress reduction techniques take time and practice. Set aside 5-15 minutes a day to do them. Therapists can offer training in these techniques. The training may be covered by some insurance plans. Other things you can do  to manage stress include:  Keeping a stress diary. This can help you learn what triggers your stress and ways to control your response.  Thinking about how you respond to certain situations. You may not be able to control everything, but you can control your reaction.  Making time for activities that help you relax, and not feeling guilty about spending your time in this way.  Therapy combined with coping and stress-reduction skills provides the best chance for successful treatment. Medicines Medicines can help ease symptoms. Medicines for anxiety include:  Anti-anxiety drugs.  Antidepressants.  Beta-blockers.  Medicines may be used as the main treatment for anxiety disorder, along with therapy, or if other treatments are not working. Medicines should be prescribed by a health care provider. Relationships Relationships can play a big part in helping you recover. Try to spend more time connecting with trusted friends and family members. Consider going to couples counseling, taking family education classes, or going to family therapy. Therapy can help you and others better understand the condition. How to recognize changes in your condition Everyone has a different response to treatment for anxiety. Recovery from anxiety happens when symptoms decrease and stop interfering with your daily activities at home or work. This may mean that you will start to:  Have better concentration and focus.  Sleep better.  Be less irritable.  Have more energy.  Have improved memory.  It is important to recognize when your condition is getting worse. Contact your health care provider if your symptoms interfere with home or work and  you do not feel like your condition is improving. Where to find help and support: You can get help and support from these sources:  Self-help groups.  Online and [employer].  A trusted spiritual leader.  Couples counseling.  Family education  classes.  Family therapy.  Follow these instructions at home:  Eat a healthy diet that includes plenty of vegetables, fruits, whole grains, low-fat dairy products, and lean protein. Do not eat a lot of foods that are high in solid fats, added sugars, or salt.  Exercise. Most adults should do the following: ? Exercise for at least 150 minutes each week. The exercise should increase your heart rate and make you sweat (moderate-intensity exercise). ? Strengthening exercises at least twice a week.  Cut down on caffeine, tobacco, alcohol, and other potentially harmful substances.  Get the right amount and quality of sleep. Most adults need 7-9 hours of sleep each night.  Make choices that simplify your life.  Take over-the-counter and prescription medicines only as told by your health care provider.  Avoid caffeine, alcohol, and certain over-the-counter cold medicines. These may make you feel worse. Ask your pharmacist which medicines to avoid.  Keep all follow-up visits as told by your health care provider. This is important. Questions to ask your health care provider  Would I benefit from therapy?  How often should I follow up with a health care provider?  How long do I need to take medicine?  Are there any long-term side effects of my medicine?  Are there any alternatives to taking medicine? Contact a health care provider if:  You have a hard time staying focused or finishing daily tasks.  You spend many hours a day feeling worried about everyday life.  You become exhausted by worry.  You start to have headaches, feel tense, or have nausea.  You urinate more than normal.  You have diarrhea. Get help right away if:  You have a racing heart and shortness of breath.  You have thoughts of hurting yourself or others. If you ever feel like you may hurt yourself or others, or have thoughts about taking your own life, get help right away. You can go to your nearest emergency  department or call:  Your local emergency services (911 in the [place]  A suicide crisis helpline, such as the National Suicide Prevention Lifeline at 808-868-1031. This is open 24-hours a day.  Summary  Taking steps to deal with stress can help calm you.  Medicines cannot cure anxiety disorders, but they can help ease symptoms.  Family, friends, and partners can play a big part in helping you recover from an anxiety disorder. This information is not intended to replace advice given to you by your health care provider. Make sure you discuss any questions you have with your health care provider. Document Released: 12/28/2015 Document Revised: 12/28/2015 Document Reviewed: 12/28/2015 Elsevier Interactive Patient Education  2018 [employer].   Living With Depression Everyone experiences occasional disappointment, sadness, and loss in their lives. When you are feeling down, blue, or sad for at least 2 weeks in a row, it may mean that you have depression. Depression can affect your thoughts and feelings, relationships, daily activities, and physical health. It is caused by changes in the way your brain functions. If you receive a diagnosis of depression, your health care provider will tell you which type of depression you have and what treatment options are available to you. If you are living with depression, there are ways  to help you recover from it and also ways to prevent it from coming back. How to cope with lifestyle changes Coping with stress Stress is your body's reaction to life changes and events, both good and bad. Stressful situations may include:  Getting married.  The death of a spouse.  Losing a job.  Retiring.  Having a baby.  Stress can last just a few hours or it can be ongoing. Stress can play a major role in depression, so it is important to learn both how to cope with stress and how to think about it differently. Talk with your health care provider or a  counselor if you would like to learn more about stress reduction. He or she may suggest some stress reduction techniques, such as:  Music therapy. This can include creating music or listening to music. Choose music that you enjoy and that inspires you.  Mindfulness-based meditation. This kind of meditation can be done while sitting or walking. It involves being aware of your normal breaths, rather than trying to control your breathing.  Centering prayer. This is a kind of meditation that involves focusing on a spiritual word or phrase. Choose a word, phrase, or sacred image that is meaningful to you and that brings you peace.  Deep breathing. To do this, expand your stomach and inhale slowly through your nose. Hold your breath for 3-5 seconds, then exhale slowly, allowing your stomach muscles to relax.  Muscle relaxation. This involves intentionally tensing muscles then relaxing them.  Choose a stress reduction technique that fits your lifestyle and personality. Stress reduction techniques take time and practice to develop. Set aside 5-15 minutes a day to do them. Therapists can offer training in these techniques. The training may be covered by some insurance plans. Other things you can do to manage stress include:  Keeping a stress diary. This can help you learn what triggers your stress and ways to control your response.  Understanding what your limits are and saying no to requests or events that lead to a schedule that is too full.  Thinking about how you respond to certain situations. You may not be able to control everything, but you can control how you react.  Adding humor to your life by watching funny films or TV shows.  Making time for activities that help you relax and not feeling guilty about spending your time this way.  Medicines Your health care provider may suggest certain medicines if he or she feels that they will help improve your condition. Avoid using alcohol and other  substances that may prevent your medicines from working properly (may interact). It is also important to:  Talk with your pharmacist or health care provider about all the medicines that you take, their possible side effects, and what medicines are safe to take together.  Make it your goal to take part in all treatment decisions (shared decision-making). This includes giving input on the side effects of medicines. It is best if shared decision-making with your health care provider is part of your total treatment plan.  If your health care provider prescribes a medicine, you may not notice the full benefits of it for 4-8 weeks. Most people who are treated for depression need to be on medicine for at least 6-12 months after they feel better. If you are taking medicines as part of your treatment, do not stop taking medicines without first talking to your health care provider. You may need to have the medicine slowly decreased (tapered)  over time to decrease the risk of harmful side effects. Relationships Your health care provider may suggest family therapy along with individual therapy and drug therapy. While there may not be family problems that are causing you to feel depressed, it is still important to make sure your family learns as much as they can about your mental health. Having your family's support can help make your treatment successful. How to recognize changes in your condition Everyone has a different response to treatment for depression. Recovery from major depression happens when you have not had signs of major depression for two months. This may mean that you will start to:  Have more interest in doing activities.  Feel less hopeless than you did 2 months ago.  Have more energy.  Overeat less often, or have better or improving appetite.  Have better concentration.  Your health care provider will work with you to decide the next steps in your recovery. It is also important to  recognize when your condition is getting worse. Watch for these signs:  Having fatigue or low energy.  Eating too much or too little.  Sleeping too much or too little.  Feeling restless, agitated, or hopeless.  Having trouble concentrating or making decisions.  Having unexplained physical complaints.  Feeling irritable, angry, or aggressive.  Get help as soon as you or your family members notice these symptoms coming back. How to get support and help from others How to talk with friends and family members about your condition Talking to friends and family members about your condition can provide you with one way to get support and guidance. Reach out to trusted friends or family members, explain your symptoms to them, and let them know that you are working with a health care provider to treat your depression. Financial resources Not all insurance plans cover mental health care, so it is important to check with your insurance carrier. If paying for co-pays or counseling services is a problem, search for a local or county mental health care center. They may be able to offer public mental health care services at low or no cost when you are not able to see a private health care provider. If you are taking medicine for depression, you may be able to get the generic form, which may be less expensive. Some makers of prescription medicines also offer help to patients who cannot afford the medicines they need. Follow these instructions at home:  Get the right amount and quality of sleep.  Cut down on using caffeine, tobacco, alcohol, and other potentially harmful substances.  Try to exercise, such as walking or lifting small weights.  Take over-the-counter and prescription medicines only as told by your health care provider.  Eat a healthy diet that includes plenty of vegetables, fruits, whole grains, low-fat dairy products, and lean protein. Do not eat a lot of foods that are high in solid  fats, added sugars, or salt.  Keep all follow-up visits as told by your health care provider. This is important. Contact a health care provider if:  You stop taking your antidepressant medicines, and you have any of these symptoms: ? Nausea. ? Headache. ? Feeling lightheaded. ? Chills and body aches. ? Not being able to sleep (insomnia).  You or your friends and family think your depression is getting worse. Get help right away if:  You have thoughts of hurting yourself or others. If you ever feel like you may hurt yourself or others, or have thoughts about taking  your own life, get help right away. You can go to your nearest emergency department or call:  Your local emergency services (911 in the [place]  A suicide crisis helpline, such as the National Suicide Prevention Lifeline at 802-751-9968. This is open 24-hours a day.  Summary  If you are living with depression, there are ways to help you recover from it and also ways to prevent it from coming back.  Work with your health care team to create a management plan that includes counseling, stress management techniques, and healthy lifestyle habits. This information is not intended to replace advice given to you by your health care provider. Make sure you discuss any questions you have with your health care provider. Document Released: 12/06/2015 Document Revised: 12/06/2015 Document Reviewed: 12/06/2015 Elsevier Interactive Patient Education  2018 [employer].   Escitalopram oral solution What is this medicine? ESCITALOPRAM (es sye TAL oh pram) is used to treat depression and certain types of anxiety. This medicine may be used for other purposes; ask your health care provider or pharmacist if you have questions. COMMON BRAND NAME(S): Lexapro What should I tell my health care provider before I take this medicine? They need to know if you have any of these conditions: -bipolar disorder or a family history of bipolar  disorder -diabetes -glaucoma -heart disease -kidney or liver disease -receiving electroconvulsive therapy -seizures (convulsions) -suicidal thoughts, plans, or attempt by you or a family member -an unusual or allergic reaction to escitalopram, citalopram, other medicines, foods, dyes, or preservatives -pregnant or trying to become pregnant -breast-feeding How should I use this medicine? Take this medicine by mouth. Follow the directions on the prescription label. Use a specially marked spoon or container to measure your medicine. Ask your pharmacist if you do not have one. Household spoons are not accurate. This medicine can be taken with or without food. Take your medicine at regular intervals. Do not take it more often than directed. Do not stop taking this medicine suddenly except upon the advice of your doctor. Stopping this medicine too quickly may cause serious side effects or your condition may worsen. A special MedGuide will be given to you by the pharmacist with each prescription and refill. Be sure to read this information carefully each time. Talk to your pediatrician regarding the use of this medicine in children. Special care may be needed. Overdosage: If you think you have taken too much of this medicine contact a poison control center or emergency room at once. NOTE: This medicine is only for you. Do not share this medicine with others. What if I miss a dose? If you miss a dose, take it as soon as you can. If it is almost time for your next dose, take only that dose. Do not take double or extra doses. What may interact with this medicine? Do not take this medicine with any of the following medications: -certain medicines for fungal infections like fluconazole, itraconazole, ketoconazole, posaconazole, voriconazole -cisapride -citalopram -dofetilide -dronedarone -linezolid -MAOIs like Carbex, Eldepryl, Marplan, Nardil, and Parnate -methylene blue (injected into a  vein) -pimozide -thioridazine -ziprasidone This medicine may also interact with the following medications: -alcohol -amphetamines -aspirin and aspirin-like medicines -carbamazepine -certain medicines for depression, anxiety, or psychotic disturbances -certain medicines for migraine headache like almotriptan, eletriptan, frovatriptan, naratriptan, rizatriptan, sumatriptan, zolmitriptan -certain medicines for sleep -certain medicines that treat or prevent blood clots like warfarin, enoxaparin, and dalteparin -cimetidine -diuretics -fentanyl -furazolidone -isoniazid -lithium -metoprolol -NSAIDs, medicines for pain and inflammation, like ibuprofen or  naproxen -other medicines that prolong the QT interval (cause an abnormal heart rhythm) -procarbazine -rasagiline -supplements like St. John's wort, kava kava, valerian -tramadol -tryptophan This list may not describe all possible interactions. Give your health care provider a list of all the medicines, herbs, non-prescription drugs, or dietary supplements you use. Also tell them if you smoke, drink alcohol, or use illegal drugs. Some items may interact with your medicine. What should I watch for while using this medicine? Tell your doctor if your symptoms do not get better or if they get worse. Visit your doctor or health care professional for regular checks on your progress. Because it may take several weeks to see the full effects of this medicine, it is important to continue your treatment as prescribed by your doctor. Patients and their families should watch out for new or worsening thoughts of suicide or depression. Also watch out for sudden changes in feelings such as feeling anxious, agitated, panicky, irritable, hostile, aggressive, impulsive, severely restless, overly excited and hyperactive, or not being able to sleep. If this happens, especially at the beginning of treatment or after a change in dose, call your health care  professional. Bonita Quin may get drowsy or dizzy. Do not drive, use machinery, or do anything that needs mental alertness until you know how this medicine affects you. Do not stand or sit up quickly, especially if you are an older patient. This reduces the risk of dizzy or fainting spells. Alcohol may interfere with the effect of this medicine. Avoid alcoholic drinks. Your mouth may get dry. Chewing sugarless gum or sucking hard candy, and drinking plenty of water may help. Contact your doctor if the problem does not go away or is severe. What side effects may I notice from receiving this medicine? Side effects that you should report to your doctor or health care professional as soon as possible: -allergic reactions like skin rash, itching or hives, swelling of the face, lips, or tongue -anxious -black, tarry stools -changes in vision -confusion -elevated mood, decreased need for sleep, racing thoughts, impulsive behavior -eye pain -fast, irregular heartbeat -feeling faint or lightheaded, falls -feeling agitated, angry, or irritable -hallucination, loss of contact with reality -loss of balance or coordination -loss of memory -restlessness, pacing, inability to keep still -seizures -stiff muscles -suicidal thoughts or other mood changes -trouble sleeping -unusual bleeding or bruising -unusually weak or tired -vomiting Side effects that usually do not require medical attention (report to your doctor or health care professional if they continue or are bothersome): -changes in appetite -change in sex drive or performance -headache -increased sweating -indigestion, nausea -tremors Ths list may not describe all possible side effects. Call your doctor for medical advice about side effects. You may report side effects to FDA at 1-800-FDA-1088. This list may not describe all possible side effects. Call your doctor for medical advice about side effects. You may report side effects to FDA at  1-800-FDA-1088. Where should I keep my medicine? Keep out of reach of children. Store at room temperature between 15 and 30 degrees C (59 and 86 degrees F). Throw away any unused medicine after the expiration date. NOTE: This sheet is a summary. It may not cover all possible information. If you have questions about this medicine, talk to your doctor, pharmacist, or health care provider.  2018 Elsevier/[employer] (2015-06-05 15:26:08)

## 2016-07-26 NOTE — Progress Notes (Signed)
Patient complains to cant go back to sleep due to medication back pain anxiety effecting sex pattern   Loosing weight

## 2016-07-26 NOTE — Progress Notes (Signed)
Subjective:  Patient ID: Zachary Krause, male    DOB: Jul 11, 1980  Age: 36 y.o. MRN: [id number]  CC: Follow-up   HPI Zachary Krause presents for medication side effects. History of depression and anxiety. He has the following symptoms: difficulty concentrating, feelings of losing control, palpitations, racing thoughts, shortness of breath. Reports symptoms started 10 years ago, gradually worsening since that time. He denies current suicidal and homicidal ideation. Previous treatment 10 years ago reports using citalopram and reports receiving counseling services for anxiety but stopped use because symptoms improved. Reports difficulty maintaining a job over the last 3 months due to anxiety.  He complains of the following side effects from the treatment: decreased libido, insomnia, poor appetite, and drowsiness.  He agrees to speaking with the LCSW at this time.   Outpatient Medications Prior to Visit  Medication Sig Dispense Refill  . ibuprofen (ADVIL,MOTRIN) 600 MG tablet Take 1 tablet (600 mg total) by mouth every 8 (eight) hours as needed for headache (Take with food.). 30 tablet 1  . FLUoxetine (PROZAC) 20 MG capsule Take 1 capsule (20 mg total) by mouth daily. 40 capsule 1   No facility-administered medications prior to visit.     ROS Review of Systems  Constitutional: Negative.   Respiratory: Negative.   Cardiovascular: Negative.   Gastrointestinal: Negative.   Skin: Negative.   Psychiatric/Behavioral: Positive for dysphoric mood and sleep disturbance. The patient is nervous/anxious.    Objective:  BP 119/68 (BP Location: Left Arm, Patient Position: Sitting, Cuff Size: Normal)   Pulse 85   Temp 98.5 F (36.9 C) (Oral)   Resp 18   Ht 6' (1.829 m)   Wt 182 lb 3.2 oz (82.6 kg)   SpO2 95%   BMI 24.71 kg/m   BP/Weight 07/26/2016 07/06/2016 05/29/2016  Systolic BP 119 102 98  Diastolic BP 68 65 56  Wt. (Lbs) 182.2 181.6 197  BMI 24.71 24.63 26.72    Physical Exam    Constitutional: He appears well-developed and well-nourished.  Eyes: Pupils are equal, round, and reactive to light. Conjunctivae are normal.  Cardiovascular: Normal rate, regular rhythm, normal heart sounds and intact distal pulses.   Pulmonary/Chest: Effort normal and breath sounds normal.  Abdominal: Soft. Bowel sounds are normal.  Skin: Skin is warm and dry.  Psychiatric: His mood appears anxious. He expresses no homicidal and no suicidal ideation. He expresses no suicidal plans and no homicidal plans.  Nursing note and vitals reviewed.   Assessment & Plan:   Problem List Items Addressed This Visit      Other   Anxiety   LCSW spoke with patient and provided resources.   Relevant Medications   traZODone (DESYREL) 50 MG tablet   escitalopram (LEXAPRO) 20 MG tablet   Other Relevant Orders   Ambulatory referral to Psychiatry    Other Visit Diagnoses    Medication side effect    -  Primary   Depression, unspecified depression type       Relevant Medications   traZODone (DESYREL) 50 MG tablet   escitalopram (LEXAPRO) 20 MG tablet   Other Relevant Orders   Ambulatory referral to Psychiatry   Insomnia due to other mental disorder       Relevant Medications   traZODone (DESYREL) 50 MG tablet      Meds ordered this encounter  Medications  . traZODone (DESYREL) 50 MG tablet    Sig: Take 0.5-1 tablets (25-50 mg total) by mouth at bedtime as needed for sleep.  Dispense:  30 tablet    Refill:  0    Order Specific Question:   Supervising Provider    Answer:   Quentin AngstJEGEDE, OLUGBEMIGA E [id number]]  . escitalopram (LEXAPRO) 20 MG tablet    Sig: Take 1 tablet (20 mg total) by mouth daily.    Dispense:  60 tablet    Refill:  1    Order Specific Question:   Supervising Provider    Answer:   Quentin AngstJEGEDE, OLUGBEMIGA E [id number]]    Follow-up: Return in about 2 months (around 09/26/2016) for Anxiety/Dep.Zachary Krause.   Zachary R Hairston FNP

## 2016-08-01 ENCOUNTER — Telehealth: Payer: Self-pay

## 2016-08-01 NOTE — Telephone Encounter (Signed)
Pt called and would like a letter stating that he is not able to work, and his medical condition. Pt needs 2 letter (lawyer, food stamps).

## 2016-08-01 NOTE — Telephone Encounter (Signed)
Can provider letter for food stamps only. I cannot determine disability he will need to find a provider who can evaluate him for disability. I would recommend he follow up with psychiatry referral.

## 2016-08-01 NOTE — Telephone Encounter (Signed)
Pt called and would like a letter stating that he is not able to work, and his medical condition. Pt needs 2 letter (lawyer, food stamps). 

## 2016-08-02 ENCOUNTER — Telehealth: Payer: Self-pay | Admitting: Family Medicine

## 2016-08-02 NOTE — Telephone Encounter (Signed)
Patient states he has been taking Lexapro  And is not working...he started taking Fluoxetine on Sunday...  Please advised

## 2016-08-03 ENCOUNTER — Other Ambulatory Visit: Payer: Self-pay | Admitting: Family Medicine

## 2016-08-03 DIAGNOSIS — F419 Anxiety disorder, unspecified: Secondary | ICD-10-CM

## 2016-08-03 DIAGNOSIS — F329 Major depressive disorder, single episode, unspecified: Secondary | ICD-10-CM

## 2016-08-03 DIAGNOSIS — F32A Depression, unspecified: Secondary | ICD-10-CM

## 2016-08-03 MED ORDER — FLUOXETINE HCL 40 MG PO CAPS: 40.0000 mg | ORAL_CAPSULE | Freq: Every day | ORAL | 2 refills | Status: DC

## 2016-08-03 NOTE — Telephone Encounter (Signed)
Most anti-anxiety and depression medications take 6 to 8 weeks to reach full effect or dosage adjustment can be made. Discontinue lexapro use. Will re-order fluoxetine and increase dosage. Recommend following up with psychiatry referral. Letter will be available for pick up tomorrow afternoon.

## 2016-08-03 NOTE — Progress Notes (Signed)
Patient called office to report he is no longer taking Lexapro and restarted taking fluoxetine on Sunday. Reported that he feels Lexapro is not working. Discontinue lexapro use. Re-order fluoxetine and increase dosage. Patient encouraged to follow up with psychiatric referral.

## 2016-08-04 NOTE — Telephone Encounter (Signed)
CMA call regarding medication & to let him know about the letter   Patient did not answer but left a VM stating the reason of the call 7 to call back if have any questions

## 2016-08-08 ENCOUNTER — Other Ambulatory Visit: Payer: Self-pay | Admitting: Family Medicine

## 2016-08-08 NOTE — Telephone Encounter (Signed)
CMA call regarding letter is ready to pick up   Patient was aware and understood

## 2016-08-17 MED FILL — FLUoxetine HCL 40 MG CAPS: 40 | 30 days supply | Qty: 30 | Fill #0

## 2016-08-22 ENCOUNTER — Other Ambulatory Visit: Payer: Self-pay | Admitting: Family Medicine

## 2016-09-04 ENCOUNTER — Telehealth: Payer: Self-pay | Admitting: Family Medicine

## 2016-09-04 NOTE — Telephone Encounter (Signed)
Called pt. To confirm appt. Pt. Had to reschedule appt. And would like PCP to know that he has been having problems with his nerves. Pt. States that people have noticed that his hand shake. Pt. States that he has not told his PCP. Please f/u

## 2016-09-05 ENCOUNTER — Ambulatory Visit: Payer: Self-pay | Admitting: Family Medicine

## 2016-09-05 NOTE — Telephone Encounter (Signed)
CMA call regarding the patient addressing a problem & whenever he has that upcoming appt to mention it to the provider   Patient di not answer but left a detailed  message

## 2016-09-05 NOTE — Telephone Encounter (Signed)
Recommend addressing at office visit.

## 2016-09-05 NOTE — Telephone Encounter (Signed)
Called pt. To confirm appt. Pt. Had to reschedule appt. And would like PCP to know that he has been having problems with his nerves. Pt. States that people have noticed that his hand shake. Pt. States that he has not told his PCP. Please advice?

## 2016-09-15 MED FILL — FLUoxetine HCL 40 MG CAPS: 40 | 30 days supply | Qty: 30 | Fill #1

## 2016-09-25 ENCOUNTER — Ambulatory Visit: Payer: Self-pay | Admitting: Family Medicine

## 2016-10-05 ENCOUNTER — Ambulatory Visit: Payer: Self-pay | Admitting: Family Medicine

## 2016-11-10 MED FILL — FLUoxetine HCL 40 MG CAPS: 40 | 30 days supply | Qty: 30 | Fill #2

## 2016-11-13 ENCOUNTER — Other Ambulatory Visit: Payer: Self-pay | Admitting: Family Medicine

## 2016-11-13 DIAGNOSIS — F5105 Insomnia due to other mental disorder: Secondary | ICD-10-CM

## 2016-11-13 DIAGNOSIS — F99 Mental disorder, not otherwise specified: Principal | ICD-10-CM

## 2016-11-23 ENCOUNTER — Ambulatory Visit: Payer: Self-pay | Admitting: Family Medicine

## 2017-02-07 ENCOUNTER — Ambulatory Visit (HOSPITAL_COMMUNITY): Payer: Self-pay | Admitting: Psychiatry

## 2017-02-26 ENCOUNTER — Encounter (HOSPITAL_COMMUNITY): Payer: Self-pay | Admitting: Emergency Medicine

## 2017-02-26 ENCOUNTER — Emergency Department (HOSPITAL_COMMUNITY)
Admission: EM | Admit: 2017-02-26 | Discharge: 2017-02-26 | Disposition: A | Discharge status: Home / Self Care | Payer: Self-pay | Attending: Emergency Medicine | Admitting: Emergency Medicine

## 2017-02-26 ENCOUNTER — Other Ambulatory Visit: Payer: Self-pay

## 2017-02-26 DIAGNOSIS — F419 Anxiety disorder, unspecified: Secondary | ICD-10-CM

## 2017-02-26 DIAGNOSIS — R51 Headache: Secondary | ICD-10-CM | POA: Insufficient documentation

## 2017-02-26 DIAGNOSIS — F329 Major depressive disorder, single episode, unspecified: Secondary | ICD-10-CM

## 2017-02-26 DIAGNOSIS — F32A Depression, unspecified: Secondary | ICD-10-CM

## 2017-02-26 DIAGNOSIS — F17210 Nicotine dependence, cigarettes, uncomplicated: Secondary | ICD-10-CM | POA: Insufficient documentation

## 2017-02-26 DIAGNOSIS — R519 Headache, unspecified: Secondary | ICD-10-CM

## 2017-02-26 MED ORDER — FLUOXETINE HCL 40 MG PO CAPS
40.0000 mg | ORAL_CAPSULE | Freq: Every day | ORAL | 2 refills | Status: AC
Start: 2017-02-26 — End: ?

## 2017-02-26 MED ORDER — METOCLOPRAMIDE HCL 10 MG PO TABS: 10.0000 mg | ORAL_TABLET | Freq: Four times a day (QID) | ORAL | 0 refills | Status: DC

## 2017-02-26 MED ORDER — IBUPROFEN 600 MG PO TABS: 600.0000 mg | ORAL_TABLET | Freq: Four times a day (QID) | ORAL | 0 refills | Status: DC | PRN

## 2017-02-26 MED ORDER — KETOROLAC TROMETHAMINE 60 MG/2ML IM SOLN
60.0000 mg | Freq: Once | INTRAMUSCULAR | Status: AC
  Administered 2017-02-26: 60 mg via INTRAMUSCULAR
  Filled 2017-02-26: qty 2

## 2017-02-26 NOTE — ED Triage Notes (Signed)
Pt reports migraine headache x7 days. Pt reports history of same and reports is out of migraine medication. Pt reports light and sound sensitivity.

## 2017-02-26 NOTE — ED Provider Notes (Signed)
[facility] EMERGENCY DEPARTMENT Provider Note   CSN: [id number] Arrival date & time: 02/26/17  1015     History   Chief Complaint Chief Complaint  Patient presents with  . Headache    HPI Zachary MannanJerry Krause is a 37 y.o. male.  HPI  37 year old male who has been diagnosed with anxiety and depression as well as panic attacks for which he takes fluoxetine.  He has had episodes of syncope as well as frequent headaches which were attributed to this underlying mental health disorder.  Ever since starting the fluoxetine 4 months ago he has been relatively headache free and reports that only over the last week has he had recurrent pain.  Right temporal pain, throbbing, waxes and wanes in intensity, seems to come and go throughout the day.  Not associated with focal neurologic deficits though he does say he has occasional nausea, occasional blurred vision, no difficulty with walking or driving a car.  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  He took ibuprofen, this did not help.  He has not had any other chronic medications for headaches and takes no other daily medicines.  He has no complaints of focal neurologic complaints at this time though he does endorse having both photophobia and phonophobia.  He has had headaches for several years and was never headache free until he went on to the fluoxetine.  He does report prior workup for headaches including a CT scan in the past which she reports was negative.  Review of the electronic medical record shows that the patient had a CT scan on April 08, 2016 which showed no acute abnormal findings.  Past Medical History:  Diagnosis Date  . Anxiety   . Depression   . MRSA (methicillin resistant staph aureus) culture positive     Patient Active Problem List   Diagnosis Date Noted  . Anxiety 04/28/2016    Past Surgical History:    Procedure Laterality Date  . HEMORRHOID SURGERY         Home Medications    Prior to Admission medications   Medication Sig Start Date End Date Taking? Authorizing Provider  traZODone (DESYREL) 50 MG tablet TAKE 1/2 TO 1 TABLET BY MOUTH AT BEDTIME AS NEEDED FOR SLEEP. 11/14/16  Yes Hairston, Mandesia R, FNP  escitalopram (LEXAPRO) 20 MG tablet Take 1 tablet (20 mg total) by mouth daily. Patient not taking: Reported on 02/26/2017 07/26/16   Lizbeth BarkHairston, Mandesia R, FNP  FLUoxetine (PROZAC) 40 MG capsule Take 1 capsule (40 mg total) by mouth daily. 02/26/17   Eber HongMiller, Chace Klippel, MD  ibuprofen (ADVIL,MOTRIN) 600 MG tablet Take 1 tablet (600 mg total) by mouth every 6 (six) hours as needed. 02/26/17   Eber HongMiller, Hobie Kohles, MD  metoCLOPramide (REGLAN) 10 MG tablet Take 1 tablet (10 mg total) by mouth every 6 (six) hours. 02/26/17   Eber HongMiller, Danese Dorsainvil, MD    Family History Family History  Problem Relation Age of Onset  . Hypertension Other   . Cancer Other     Social History Social History   Tobacco Use  . Smoking status: Current Every Day Smoker    Packs/day: 0.50    Years: 13.00    Pack years: 6.50    Types: Cigarettes  . Smokeless tobacco: Never Used  Substance Use Topics  . Alcohol use: Yes    Comment: occ  .  Drug use: Yes    Types: Marijuana     Allergies   Pollen extract and Prozac [fluoxetine]   Review of Systems Review of Systems  All other systems reviewed and are negative.    Physical Exam Updated Vital Signs BP 128/72 (BP Location: Right Arm)   Pulse 67   Temp 98.6 F (37 C) (Oral)   Resp 18   Ht 6' (1.829 m)   Wt 93 kg (205 lb)   SpO2 99%   BMI 27.80 kg/m   Physical Exam  Constitutional: He appears well-developed and well-nourished. No distress.  HENT:  Head: Normocephalic and atraumatic.  Mouth/Throat: Oropharynx is clear and moist. No oropharyngeal exudate.  There is no tenderness over the right temporal region  Eyes: Conjunctivae and EOM are normal. Pupils  are equal, round, and reactive to light. Right eye exhibits no discharge. Left eye exhibits no discharge. No scleral icterus.  Neck: Normal range of motion. Neck supple. No JVD present. No thyromegaly present.  Cardiovascular: Normal rate, regular rhythm, normal heart sounds and intact distal pulses. Exam reveals no gallop and no friction rub.  No murmur heard. Pulmonary/Chest: Effort normal and breath sounds normal. No respiratory distress. He has no wheezes. He has no rales.  Abdominal: Soft. Bowel sounds are normal. He exhibits no distension and no mass. There is no tenderness.  Musculoskeletal: Normal range of motion. He exhibits no edema or tenderness.  Lymphadenopathy:    He has no cervical adenopathy.  Neurological: He is alert. Coordination normal.  Speech is clear, cranial nerves III through XII are intact, memory is intact, strength is normal in all 4 extremities including grips, sensation is intact to light touch and pinprick in all 4 extremities. Coordination as tested by finger-nose-finger is normal, no limb ataxia. Normal gait, normal reflexes at the patellar tendons bilaterally  Skin: Skin is warm and dry. No rash noted. No erythema.  Psychiatric: He has a normal mood and affect. His behavior is normal.  Nursing note and vitals reviewed.    ED Treatments / Results  Labs (all labs ordered are listed, but only abnormal results are displayed) Labs Reviewed - No data to display   Radiology No results found.  Procedures Procedures (including critical care time)  Medications Ordered in ED Medications  ketorolac (TORADOL) injection 60 mg (not administered)     Initial Impression / Assessment and Plan / ED Course  I have reviewed the triage vital signs and the nursing notes.  Pertinent labs & imaging results that were available during my care of the patient were reviewed by me and considered in my medical decision making (see chart for details).     The patient's exam  is unremarkable, there is no indication for advanced imaging, he has already been evaluated at multiple specialist including a neurologist and has no answer to why he has headaches.  At this time he will be given some abortive medications and encouraged to follow-up in the outpatient setting.  He will not be giving anything sedating as he has to drive himself home.  He is in agreement with this plan.  Final Clinical Impressions(s) / ED Diagnoses   Final diagnoses:  Right sided temporal headache    ED Discharge Orders        Ordered    metoCLOPramide (REGLAN) 10 MG tablet  Every 6 hours     02/26/17 1248    ibuprofen (ADVIL,MOTRIN) 600 MG tablet  Every 6 hours PRN     02/26/17  1248    FLUoxetine (PROZAC) 40 MG capsule  Daily     02/26/17 1248       Eber Hong, MD 02/26/17 1248

## 2017-02-26 NOTE — Discharge Instructions (Signed)
Headache:  You are having a headache. No specific cause was found today for your headache. It may have been a migraine or other cause of headache. Stress, anxiety, fatigue, and depression are common triggers for headaches. Your headache today does not appear to be life-threatening or require hospitalization, but often the exact cause of headaches is not determined in the emergency department. Therefore, followup with your doctor is very important to find out what may have caused your headache, and whether or not you need any further diagnostic testing or treatment. Sometimes headaches can appear benign but then more serious symptoms can develop which should prompt an immediate reevaluation by your doctor or the emergency department.  Seek immediate medical attention if:  You develop possible problems with medications prescribed. The medications don't resolve your headache, if it recurs, or if you have multiple episodes of vomiting or can't take fluids by mouth You have a change from the usual headache. If you developed a sudden severe headache or confusion, become poorly responsive or faint, developed a fever above 100.4 or problems breathing, have a change in speech, vision, swallowing or understanding, or developed new weakness, numbness, tingling, incoordination or have a seizure.  If you don't have a family doctor to follow up with, see the follow up list below - call this morning for a follow-up appointment in the next 1-2 days.  RESOURCE GUIDE  Dental Problems  Patients with Medicaid: [facility]                     [facility] [address]                                           [address] Phone:  [phone]                                                  Phone:  [phone]  If unable to pay or uninsured, contact:  [facility] or [facility] to become qualified for the adult dental clinic.  Chronic Pain Problems Contact [facility]  [phone] Patients need to be referred by their primary care doctor.  Insufficient Money for Medicine Contact [employer]:  call "211" or [facility] 271-5999.  No Primary Care Doctor Call [facility]  832-8000 Other agencies that provide inexpensive medical care    [facility]  [phone]    [facility]  [phone]    [facility]  [phone]    Women's Clinic  [phone]    [facility]  [phone]    [facility]  [phone]  Psychological Services [facility]  [phone] [facility]  [phone] [facility]   800 853-5163 (emergency services 641-4993)  Substance Abuse Resources [facility]  336-882-2125 [facility] 336-784-9470 [facility] 336-285-9073 [facility] 336-845-3988 Residential & Outpatient Substance Abuse Program  800-659-3381  Abuse/Neglect [place] Child Abuse Hotline (336) 641-3795 [place] Child Abuse Hotline 800-378-5315 (After Hours)  Emergency Shelter [facility] (336) 271-5985  M[facility] Homes [facility] (336) 275-9566 [facility] (704) 372-4663  MRSA Hotline #:     832-7006    [place] Resources  [facility] [place]     [employer]                          [facility] [address] [place]                       [address]      [address]  [place]                                                [place]                            [place] Phone:  [phone]                                   Phone:  [phone]                 Phone:  [phone]  [facility] Phone:  [phone]  [place] Child Abuse Hotline (336) 342-1394 (336) 342-3537 (After Hours)   

## 2017-02-26 NOTE — ED Notes (Signed)
ED Provider at bedside. 

## 2017-07-11 ENCOUNTER — Other Ambulatory Visit: Payer: Self-pay

## 2017-07-11 ENCOUNTER — Encounter (HOSPITAL_COMMUNITY): Payer: Self-pay | Admitting: Emergency Medicine

## 2017-07-11 ENCOUNTER — Emergency Department (HOSPITAL_COMMUNITY)
Admission: EM | Admit: 2017-07-11 | Discharge: 2017-07-11 | Disposition: A | Discharge status: Home / Self Care | Payer: Self-pay | Attending: Emergency Medicine | Admitting: Emergency Medicine

## 2017-07-11 DIAGNOSIS — L739 Follicular disorder, unspecified: Secondary | ICD-10-CM | POA: Insufficient documentation

## 2017-07-11 DIAGNOSIS — H00015 Hordeolum externum left lower eyelid: Secondary | ICD-10-CM | POA: Insufficient documentation

## 2017-07-11 DIAGNOSIS — F17210 Nicotine dependence, cigarettes, uncomplicated: Secondary | ICD-10-CM | POA: Insufficient documentation

## 2017-07-11 DIAGNOSIS — Z79899 Other long term (current) drug therapy: Secondary | ICD-10-CM | POA: Insufficient documentation

## 2017-07-11 MED ORDER — DOXYCYCLINE HYCLATE 100 MG PO CAPS: 100.0000 mg | ORAL_CAPSULE | Freq: Two times a day (BID) | ORAL | 0 refills | Status: AC

## 2017-07-11 NOTE — ED Provider Notes (Signed)
[facility] EMERGENCY DEPARTMENT Provider Note   CSN: [id number] Arrival date & time: 07/11/17  0454     History   Chief Complaint Chief Complaint  Patient presents with  . Groin Swelling    HPI Zachary Krause is a 37 y.o. male.  The history is provided by the patient.  Groin Pain  This is a new problem. The current episode started 2 days ago. The problem occurs daily. The problem has been gradually worsening. Pertinent negatives include no abdominal pain. Exacerbated by: palpation. Nothing relieves the symptoms.   Patient presents for multiple complaints.  He reports several days ago he noticed a rash on his left forearm that has since resolved.  He also reports bumps on his left lower lip.  He also reports swelling and a bump in his left eye.  Over the past 2 days he is also reported right groin swelling.  No fevers or vomiting.  No abdominal pain, no dysuria.  No known exposures or insect bites.  No new medications. Currently is not taking any daily medications Past Medical History:  Diagnosis Date  . Anxiety   . Depression   . MRSA (methicillin resistant staph aureus) culture positive     Patient Active Problem List   Diagnosis Date Noted  . Anxiety 04/28/2016    Past Surgical History:  Procedure Laterality Date  . HEMORRHOID SURGERY          Home Medications    Prior to Admission medications   Medication Sig Start Date End Date Taking? Authorizing Provider  doxycycline (VIBRAMYCIN) 100 MG capsule Take 1 capsule (100 mg total) by mouth 2 (two) times daily. One po bid x 7 days 07/11/17   Zadie Rhine, MD  escitalopram (LEXAPRO) 20 MG tablet Take 1 tablet (20 mg total) by mouth daily. Patient not taking: Reported on 02/26/2017 07/26/16   Lizbeth Bark, FNP  FLUoxetine (PROZAC) 40 MG capsule Take 1 capsule (40 mg total) by mouth daily. 02/26/17   Eber Hong, MD  traZODone (DESYREL) 50 MG tablet TAKE 1/2 TO 1 TABLET BY MOUTH AT BEDTIME AS NEEDED FOR SLEEP.  11/14/16   Lizbeth Bark, FNP    Family History Family History  Problem Relation Age of Onset  . Hypertension Other   . Cancer Other     Social History Social History   Tobacco Use  . Smoking status: Current Every Day Smoker    Packs/day: 0.50    Years: 13.00    Pack years: 6.50    Types: Cigarettes  . Smokeless tobacco: Never Used  Substance Use Topics  . Alcohol use: Yes    Comment: occ  . Drug use: Yes    Types: Marijuana     Allergies   Pollen extract and Prozac [fluoxetine]   Review of Systems Review of Systems  Eyes: Positive for pain.  Gastrointestinal: Negative for abdominal pain and vomiting.  Genitourinary: Negative for dysuria.  Skin: Positive for rash.  All other systems reviewed and are negative.    Physical Exam Updated Vital Signs BP 122/80 (BP Location: Left Arm)   Pulse 66   Temp 98.1 F (36.7 C) (Oral)   Resp 16   Ht 1.829 m (6')   Wt 90.7 kg (200 lb)   SpO2 99%   BMI 27.12 kg/m   Physical Exam  CONSTITUTIONAL: Well developed/well nourished HEAD: Normocephalic/atraumatic EYES: EOMI/PERRL, stye to left lower eyelid, no proptosis ENMT: Mucous membranes moist, no angioedema, no lesions noted in the oral  mucosa NECK: supple no meningeal signs SPINE/BACK:entire spine nontender CV: S1/S2 noted, no murmurs/rubs/gallops noted LUNGS: Lungs are clear to auscultation bilaterally, no apparent distress ABDOMEN: soft, nontender, no rebound or guarding, bowel sounds noted throughout abdomen GU:no cva tenderness, no inguinal hernia, no testicular tenderness, small folliculitis noted to right scrotum, no crepitus, no drainage, nurse chaperone present for exam NEURO: Pt is awake/alert/appropriate, moves all extremitiesx4.  No facial droop.   EXTREMITIES:  full ROM SKIN: warm, color normal PSYCH: no abnormalities of mood noted, alert and oriented to situation  ED Treatments / Results  Labs (all labs ordered are listed, but only abnormal  results are displayed) Labs Reviewed - No data to display  EKG None  Radiology No results found.  Procedures Procedures   Medications Ordered in ED Medications - No data to display   Initial Impression / Assessment and Plan / ED Course  I have reviewed the triage vital signs and the nursing notes.   Patient with multiple complaints, but the main issues I can identify are a stye of the left eye, as well as early folliculitis/early abscess to the scrotum.  Advised warm compresses to scrotum, avoid sexual activity, will start doxycycline.  I do not feel this is amenable to I&D at this time  Final Clinical Impressions(s) / ED Diagnoses   Final diagnoses:  Hordeolum externum of left lower eyelid  Folliculitis    ED Discharge Orders        Ordered    doxycycline (VIBRAMYCIN) 100 MG capsule  2 times daily     07/11/17 0519       Zadie RhineWickline, Keandra Medero, MD 07/11/17 [id number]

## 2017-07-11 NOTE — ED Triage Notes (Signed)
Pt c/o right sided groin swelling x 2 days. Pt states he has a bubble on his gum/lip and right eye swollen.

## 2019-03-04 IMAGING — CT CT HEAD W/O CM
3 of 7 series · 16 of 47 positions shown, 19 images · non-contrast
Comparison: Prior head CT from 11/25/2011.

CLINICAL DATA: Initial evaluation for syncope, fall.

EXAM:
CT HEAD WITHOUT CONTRAST
CT MAXILLOFACIAL WITHOUT CONTRAST
TECHNIQUE: Multidetector CT imaging of the head and maxillofacial structures
were performed using the standard protocol without intravenous
contrast. Multiplanar CT image reconstructions of the maxillofacial
structures were also generated.

[Series 5: head without cor · coronal · non-contrast · 0.32mm/px · 3 of 70 slices shown]
[im 18/70  brain]
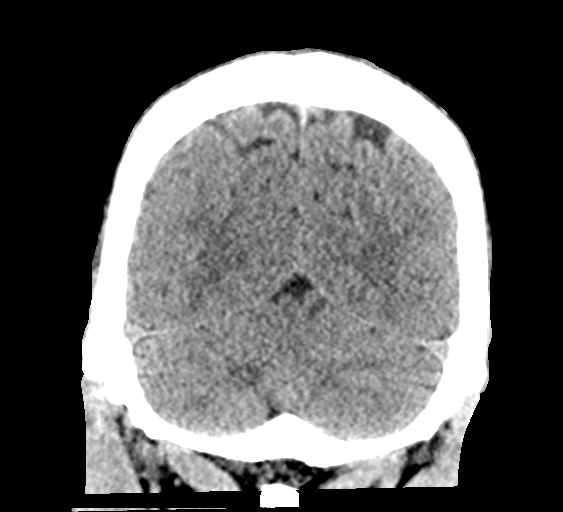
[im 35/70  brain]
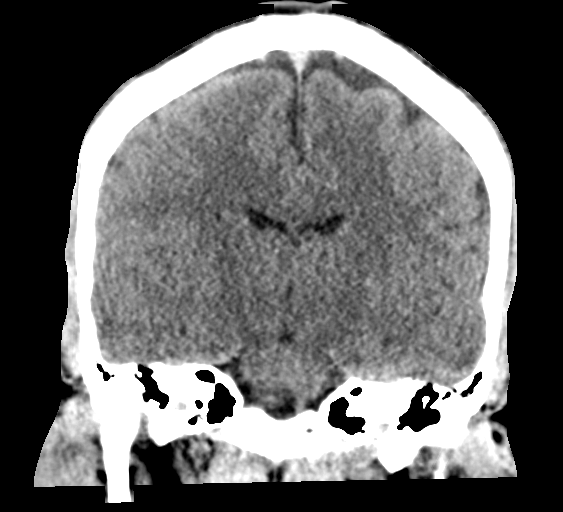
[im 52/70  brain]
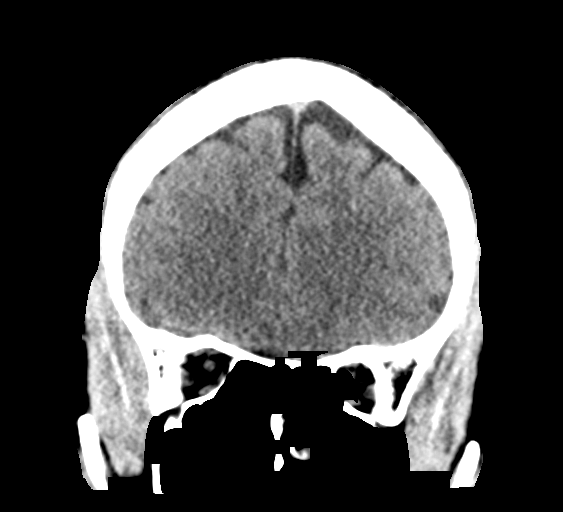

[Series 7: facialbone 2.0 st · axial · 0.37mm/px · z∈[-71,+79]mm · 11 of 91 slices shown, 14 images]
[im 8/91  brain]
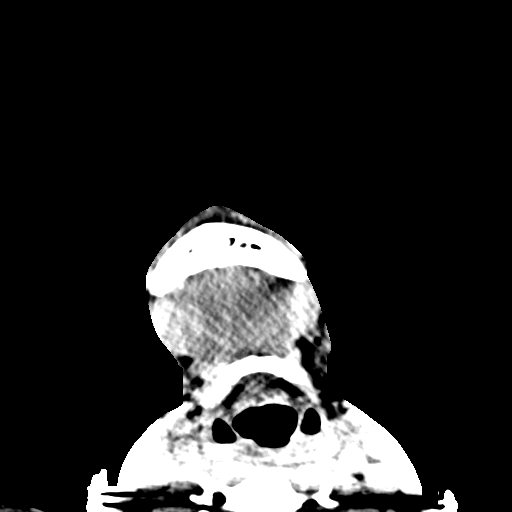
[im 8/91  bone]
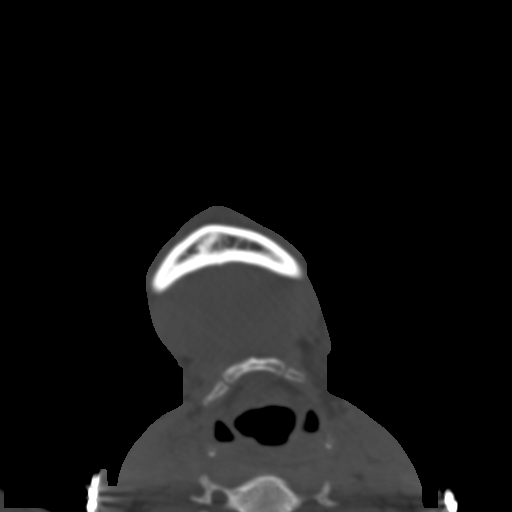
[im 16/91  brain]
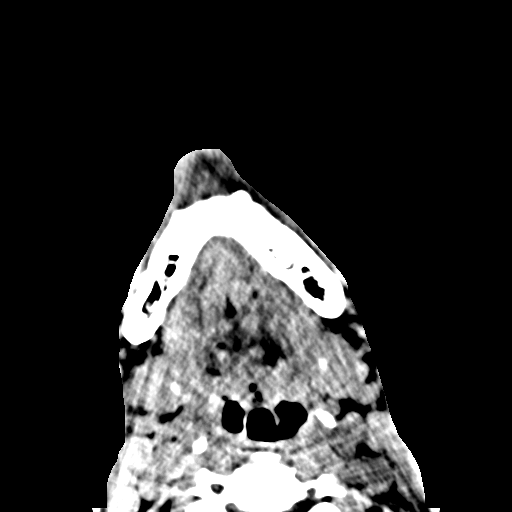
[im 23/91  brain]
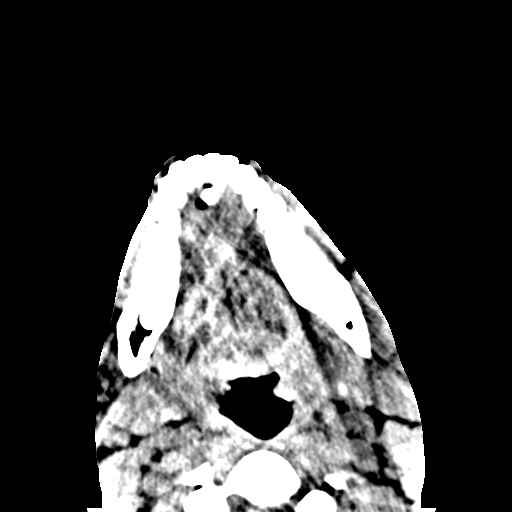
[im 31/91  brain]
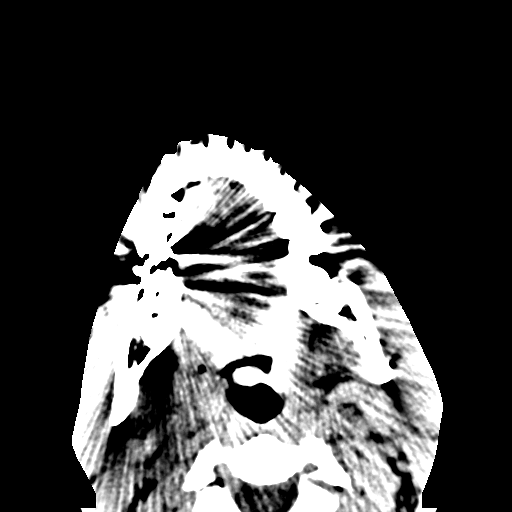
[im 38/91  brain]
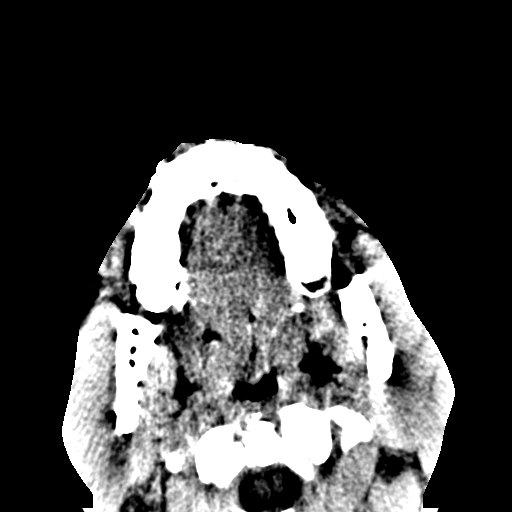
[im 38/91  bone]
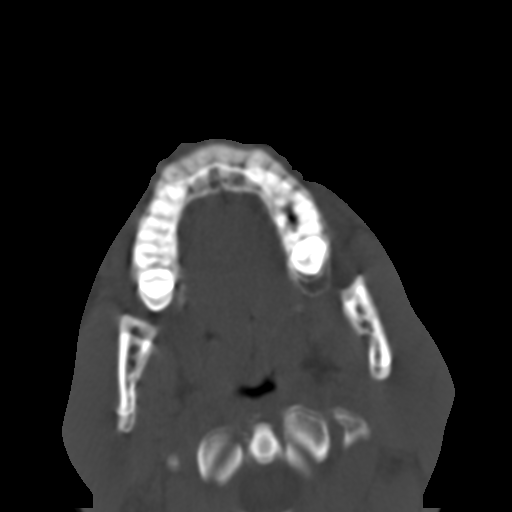
[im 46/91  brain]
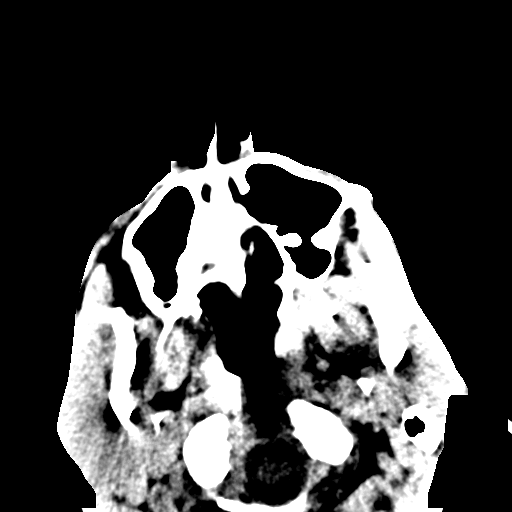
[im 53/91  brain]
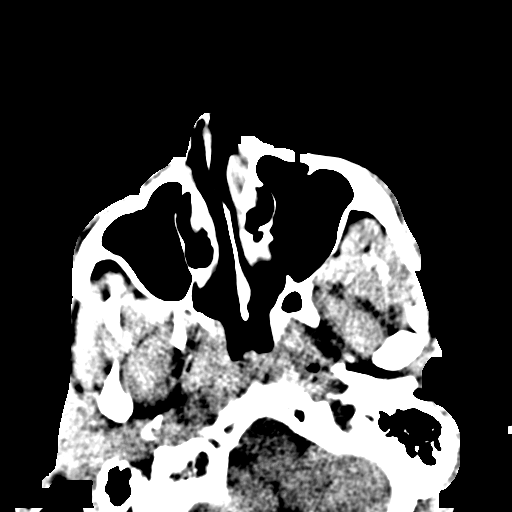
[im 61/91  brain]
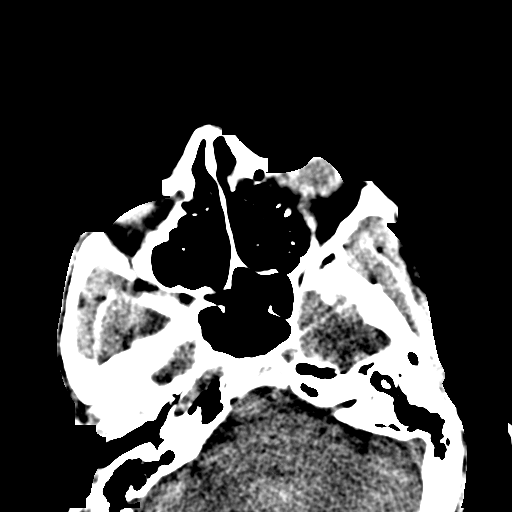
[im 68/91  brain]
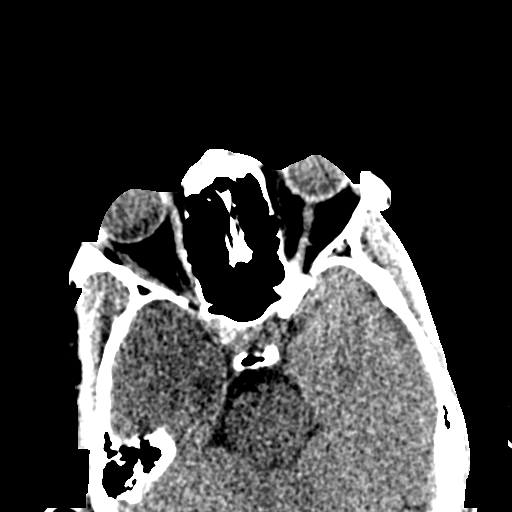
[im 68/91  bone]
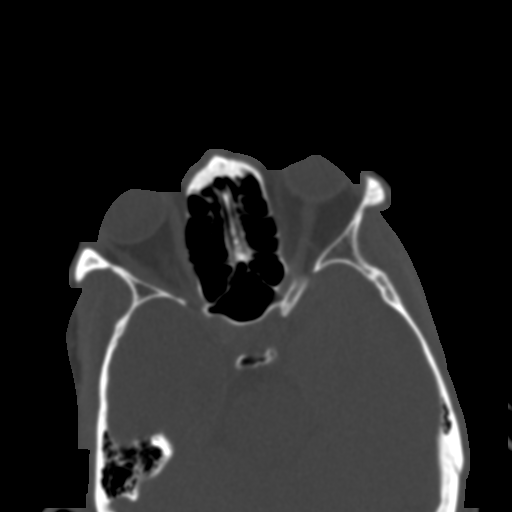
[im 76/91  brain]
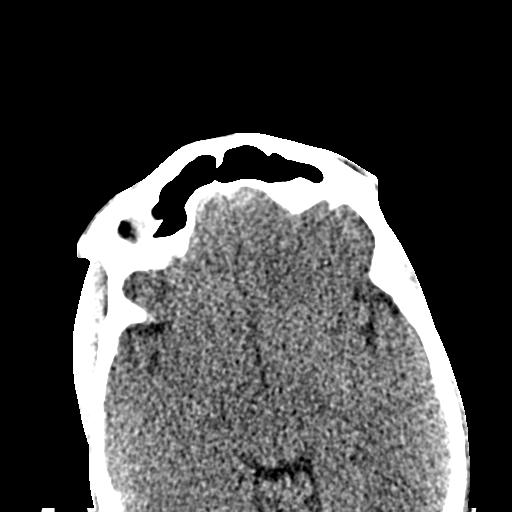
[im 83/91  brain]
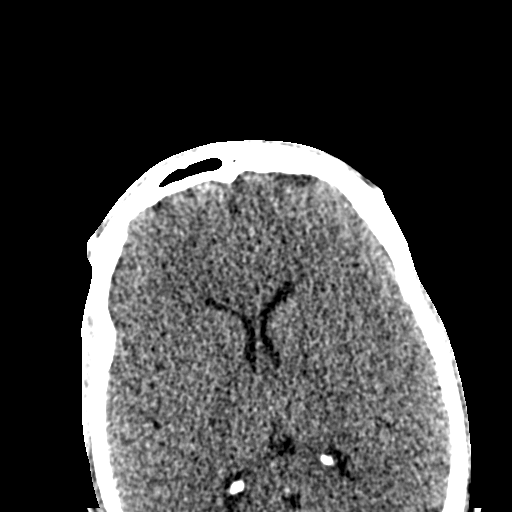

[Series 12: facialbone 2.0 sag st · sagittal · 0.35mm/px · 2 of 85 slices shown]
[im 29/85  brain]
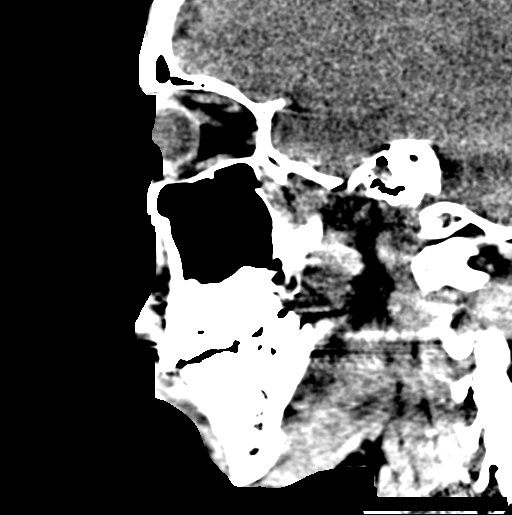
[im 57/85  brain]
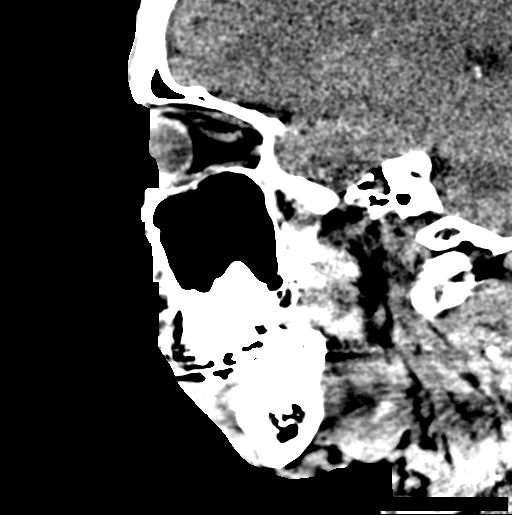

[16 of 47 positions shown; findings below may reference images not displayed]

FINDINGS: CT HEAD FINDINGS

Brain: Cerebral volume normal. No acute intracranial hemorrhage. No
evidence for acute infarct. No mass lesion, midline shift or mass
effect. No hydrocephalus. No extra-axial fluid collection.

Vascular: No hyperdense vessel.

Skull: Scalp soft tissues within normal limits.  Calvarium intact.

Other: Mastoids are clear.

CT MAXILLOFACIAL FINDINGS

Osseous:  Study degraded by motion.

Zygomatic arches intact. No acute maxillary fracture. Pterygoid
plates intact. Nasal bones grossly intact. Nasal septum mildly bowed
to the right but intact. No definite acute mandibular fracture.
Mandibular condyles normally positioned. No acute abnormality about
the dentition.

Orbits: Globes intact. No retro-orbital hematoma or other pathology.
Bony orbits intact.

Sinuses: Tiny retention cysts noted within left maxillary sinus.
Otherwise clear.

Soft tissues: No appreciable soft tissue swelling about the face.
IMPRESSION: 1. No acute intracranial process.
2. No acute maxillofacial injury identified.
